# Patient Record
Sex: Male | Born: 1950
Health system: Southern US, Community
[De-identification: ages and names within clinical notes are randomized; demographics above are authoritative.]

## PROBLEM LIST (undated history)

## (undated) DIAGNOSIS — G4733 Obstructive sleep apnea (adult) (pediatric): Secondary | ICD-10-CM

## (undated) DIAGNOSIS — G5600 Carpal tunnel syndrome, unspecified upper limb: Secondary | ICD-10-CM

## (undated) DIAGNOSIS — G4730 Sleep apnea, unspecified: Secondary | ICD-10-CM

## (undated) DIAGNOSIS — Z8619 Personal history of other infectious and parasitic diseases: Secondary | ICD-10-CM

## (undated) DIAGNOSIS — G5622 Lesion of ulnar nerve, left upper limb: Secondary | ICD-10-CM

## (undated) HISTORY — PX: COLOSTOMY: SHX63

## (undated) HISTORY — DX: Sleep apnea, unspecified: G47.30

## (undated) HISTORY — DX: Carpal tunnel syndrome, unspecified upper limb: G56.00

## (undated) HISTORY — PX: CARPAL TUNNEL WITH CUBITAL TUNNEL: SHX5608

## (undated) HISTORY — DX: Obstructive sleep apnea (adult) (pediatric): G47.33

## (undated) HISTORY — PX: BACK SURGERY: SHX140

## (undated) HISTORY — PX: SHOULDER SURGERY: SHX246

## (undated) HISTORY — PX: NECK SURGERY: SHX720

## (undated) HISTORY — PX: CERVICAL FUSION: SHX112

---

## 2017-09-27 DIAGNOSIS — M19021 Primary osteoarthritis, right elbow: Secondary | ICD-10-CM | POA: Diagnosis not present

## 2017-10-02 DIAGNOSIS — M546 Pain in thoracic spine: Secondary | ICD-10-CM | POA: Diagnosis not present

## 2017-10-02 DIAGNOSIS — S239XXA Sprain of unspecified parts of thorax, initial encounter: Secondary | ICD-10-CM | POA: Diagnosis not present

## 2017-11-04 DIAGNOSIS — M25512 Pain in left shoulder: Secondary | ICD-10-CM | POA: Diagnosis not present

## 2017-12-19 ENCOUNTER — Telehealth: Payer: Self-pay | Admitting: Pulmonary Disease

## 2017-12-19 NOTE — Telephone Encounter (Signed)
Spoke with pt. Pt has an upcoming sleep consult with [facility] on 01/24/18. He is needing a copy of his download report for his life insurance policy. Advised him to bring his SD card with him and we would happy to give him a copy of his download. Nothing further was needed.

## 2018-01-20 DIAGNOSIS — G5602 Carpal tunnel syndrome, left upper limb: Secondary | ICD-10-CM | POA: Diagnosis not present

## 2018-01-20 DIAGNOSIS — Z6824 Body mass index (BMI) 24.0-24.9, adult: Secondary | ICD-10-CM | POA: Diagnosis not present

## 2018-01-20 DIAGNOSIS — M5090 Cervical disc disorder, unspecified, unspecified cervical region: Secondary | ICD-10-CM | POA: Diagnosis not present

## 2018-01-20 DIAGNOSIS — Z1389 Encounter for screening for other disorder: Secondary | ICD-10-CM | POA: Diagnosis not present

## 2018-01-20 DIAGNOSIS — M5136 Other intervertebral disc degeneration, lumbar region: Secondary | ICD-10-CM | POA: Diagnosis not present

## 2018-01-24 ENCOUNTER — Ambulatory Visit: Payer: 59 | Admitting: Pulmonary Disease

## 2018-01-24 ENCOUNTER — Encounter: Payer: Self-pay | Admitting: Pulmonary Disease

## 2018-01-24 VITALS — BP 100/60 | HR 55 | Ht 71.0 in | Wt 184.8 lb

## 2018-01-24 DIAGNOSIS — Z9989 Dependence on other enabling machines and devices: Secondary | ICD-10-CM

## 2018-01-24 DIAGNOSIS — G4733 Obstructive sleep apnea (adult) (pediatric): Secondary | ICD-10-CM | POA: Diagnosis not present

## 2018-01-24 NOTE — Progress Notes (Signed)
   Subjective:    Patient ID: Richard Schneider, male    DOB: December 23, 1950, 67 y.o.   MRN: [id number]  HPI    Review of Systems  Constitutional: Negative for fever and unexpected weight change.  HENT: Negative for congestion, dental problem, ear pain, nosebleeds, postnasal drip, rhinorrhea, sinus pressure, sneezing, sore throat and trouble swallowing.   Eyes: Negative for redness and itching.  Respiratory: Negative for cough, chest tightness, shortness of breath and wheezing.   Cardiovascular: Negative for palpitations and leg swelling.  Gastrointestinal: Negative for nausea and vomiting.  Genitourinary: Negative for dysuria.  Musculoskeletal: Negative for joint swelling.  Skin: Negative for rash.  Allergic/Immunologic: Negative.  Negative for environmental allergies, food allergies and immunocompromised state.  Neurological: Negative for headaches.  Hematological: Does not bruise/bleed easily.  Psychiatric/Behavioral: Negative for dysphoric mood. The patient is not nervous/anxious.        Objective:   Physical Exam        Assessment & Plan:

## 2018-01-24 NOTE — Patient Instructions (Addendum)
Check with your dentist about whether you are a candidate for an oral appliance to treat obstructive sleep apnea  Will arrange for new home care company to supply CPAP mask and parts  Call if you decide you want to get a new CPAP machine  Please forward a copy of your sleep study to our office  Follow up in 6 months

## 2018-01-24 NOTE — Progress Notes (Signed)
[facility]  Chief Complaint  Patient presents with  . sleep consult    self referral. Pt been on cpap over 18 years. Needs to establish sleep physician; changed jobs, and need to get new machine with supplies and new DME    Vital signs: BP 100/60 (BP Location: Left Arm, Cuff Size: Normal)   Pulse (!) 55   Ht 5\' 11"  (1.803 m)   Wt 184 lb 12.8 oz (83.8 kg)   SpO2 100%   BMI 25.77 kg/m   History of Present Illness: Richard Schneider is a 67 y.o. male for evaluation of sleep problems.  He has a sleep study while living in [place] about 18 years ago.  From his description he had a split night study.  He has been using CPAP ever since.  He has moved around to several places.  He returned to [facility] about 1 year ago.  He says he can't sleep without CPAP.  If he falls asleep before putting CPAP on, he will then wake up with a cough and choking feeling.  He will also snore.  He feels sleepy during the day if he doesn't use CPAP the night before, then he feels very sleepy.  His CPAP download for past 90 days shows AHI 1.2 with CPAP 8 cm H2O with 8 hours use per night on average.  He uses nasal pillows mask.  He goes to sleep at 9 pm.  He falls asleep within 20 minutes.  He wakes up several times due to pain in his left arm from carpal tunnel.  He gets out of bed at 530 am.  He feels okay in the morning.  He denies morning headache.  He does not use anything to help him fall sleep or stay awake.  He denies sleep walking, sleep talking, bruxism, or nightmares.  There is no history of restless legs.  He denies sleep hallucinations, sleep paralysis, or cataplexy.  The Epworth score is 2 out of 24.    Physical Exam:  General - pleasant Eyes - pupils reactive ENT - no sinus tenderness, no oral exudate, no LAN, MP 2 Cardiac - regular, no murmur Chest - no wheeze, rales Abd - soft, non tender Ext - no edema Skin - no rashes Neuro - normal  strength Psych - normal mood  Discussion: He has history of sleep apnea and has been on CPAP.  He is needing a new DME for replacement supplies.  He might be interested in a new machine, and is interested in alternative therapies to CPAP.  We discussed how sleep apnea can affect various health problems, including risks for hypertension, cardiovascular disease, and diabetes.  We also discussed how sleep disruption can increase risks for accidents, such as while driving.  Weight loss as a means of improving sleep apnea was also reviewed.  Additional treatment options discussed were CPAP therapy, oral appliance, and surgical intervention.  Assessment/Plan:  Obstructive sleep apnea. - he will forward a copy of his prior sleep study - will arrange for new DME to set up supplies - he will call if he wants to get a new machine >> explained that he might need new sleep study - he will check with his dentist about whether he is a candidate for an oral appliance   Patient Instructions  Check with your dentist about whether you are a candidate for an oral appliance to treat obstructive sleep apnea  Will arrange for new home care company  to supply CPAP mask and parts  Call if you decide you want to get a new CPAP machine  Please forward a copy of your sleep study to our office  Follow up in 6 months    Chesley Mires, MD [place] 01/24/2018, 4:13 PM Pager:  [phone]  Flow Sheet  Sleep tests:  Review of Systems: Review of Systems  Constitutional: Negative for fever and unexpected weight change.  HENT: Negative for congestion, dental problem, ear pain, nosebleeds, postnasal drip, rhinorrhea, sinus pressure, sneezing, sore throat and trouble swallowing.   Eyes: Negative for redness and itching.  Respiratory: Negative for cough, chest tightness, shortness of breath and wheezing.   Cardiovascular: Negative for palpitations and leg swelling.  Gastrointestinal:  Negative for nausea and vomiting.  Genitourinary: Negative for dysuria.  Musculoskeletal: Negative for joint swelling.  Skin: Negative for rash.  Allergic/Immunologic: Negative.  Negative for environmental allergies, food allergies and immunocompromised state.  Neurological: Negative for headaches.  Hematological: Does not bruise/bleed easily.  Psychiatric/Behavioral: Negative for dysphoric mood. The patient is not nervous/anxious.    Past Medical History: He  has a past medical history of Carpal tunnel syndrome and OSA (obstructive sleep apnea).  Past Surgical History: He  has a past surgical history that includes Back surgery and Neck surgery.  Family History: His family history includes Cancer in his maternal grandmother; Heart block in his mother.  Social History: He  reports that  has never smoked. he has never used smokeless tobacco. He reports that he drinks about 3.0 oz of alcohol per week. He reports that he does not use drugs.  Medications: Allergies as of 01/24/2018   Not on File     Medication List    as of 01/24/2018  4:13 PM   You have not been prescribed any medications.

## 2018-01-27 ENCOUNTER — Telehealth: Payer: Self-pay | Admitting: Pulmonary Disease

## 2018-01-27 DIAGNOSIS — G4733 Obstructive sleep apnea (adult) (pediatric): Secondary | ICD-10-CM

## 2018-01-27 NOTE — Telephone Encounter (Signed)
Richard Schneider can you look out for this?

## 2018-01-27 NOTE — Telephone Encounter (Signed)
Spoke with Barbaraann Rondo, he needs another order for CPAP supplies with the [employer]. Order placed. He told rodney that he would come by here to drop off the sleep study today. Barbaraann Rondo needs this faxed to him as well.

## 2018-01-28 ENCOUNTER — Telehealth: Payer: Self-pay | Admitting: Pulmonary Disease

## 2018-01-28 NOTE — Telephone Encounter (Signed)
Routing this to [facility] for follow up.

## 2018-01-29 NOTE — Telephone Encounter (Signed)
We have received the sleep study and it is currently in Dr. Juanetta Gosling office.  Will wait for [facility] to view the HST and then fax to [facility].  Routing this to [facility] to be on the look out for the HST after Dr. Halford Chessman reviews it.

## 2018-01-30 ENCOUNTER — Encounter: Payer: Self-pay | Admitting: Pulmonary Disease

## 2018-01-30 ENCOUNTER — Telehealth: Payer: Self-pay | Admitting: Pulmonary Disease

## 2018-01-30 DIAGNOSIS — G4733 Obstructive sleep apnea (adult) (pediatric): Secondary | ICD-10-CM | POA: Insufficient documentation

## 2018-01-30 NOTE — Telephone Encounter (Signed)
Called and advised patient that this is something he will have to contact is medical insurance about to see if it is covered. He verbalized understanding. Advised patient that if he needed anything to let us know.

## 2018-01-30 NOTE — Telephone Encounter (Signed)
See phone note from 2.25.19. Will sign off.

## 2018-01-30 NOTE — Telephone Encounter (Signed)
Will forward to [facility]. After VS reviews the HST then we need to fax to [place] with [facility] to have on file. Will keep message open to follow up on.

## 2018-01-31 NOTE — Telephone Encounter (Signed)
Ok, order placed with the corrections. FYI PCC"s.

## 2018-01-31 NOTE — Telephone Encounter (Signed)
Rec'd sleep study forms from [facility] this morning Faxed copy to [place] with [facility] today Per [place] in [facility], they do not need copy Nothing further needed at this time

## 2018-01-31 NOTE — Telephone Encounter (Signed)
[place].  Calling stating order for cpap supplies needs to be changed from "mask of choice" TO "nasal pillow mask" and needs standard tubing.

## 2018-01-31 NOTE — Telephone Encounter (Signed)
I will send the order once it's signed  °

## 2018-02-03 NOTE — Telephone Encounter (Signed)
Routing message to VS to sign order for the pt's nasal pillow mask Therefore [facility] can provide to complete order for patient  VS could you please sign the order request

## 2018-02-05 DIAGNOSIS — G5622 Lesion of ulnar nerve, left upper limb: Secondary | ICD-10-CM | POA: Diagnosis not present

## 2018-02-05 DIAGNOSIS — M25512 Pain in left shoulder: Secondary | ICD-10-CM | POA: Diagnosis not present

## 2018-02-05 DIAGNOSIS — G5602 Carpal tunnel syndrome, left upper limb: Secondary | ICD-10-CM | POA: Diagnosis not present

## 2018-02-05 NOTE — Telephone Encounter (Signed)
It appears that order has been signed. Will close encounter, as nothing further is needed.

## 2018-03-03 DIAGNOSIS — G4733 Obstructive sleep apnea (adult) (pediatric): Secondary | ICD-10-CM | POA: Diagnosis not present

## 2018-03-24 DIAGNOSIS — G5602 Carpal tunnel syndrome, left upper limb: Secondary | ICD-10-CM | POA: Diagnosis not present

## 2018-03-24 DIAGNOSIS — M79642 Pain in left hand: Secondary | ICD-10-CM | POA: Diagnosis not present

## 2018-03-24 DIAGNOSIS — G5622 Lesion of ulnar nerve, left upper limb: Secondary | ICD-10-CM | POA: Diagnosis not present

## 2018-05-26 DIAGNOSIS — M79642 Pain in left hand: Secondary | ICD-10-CM | POA: Diagnosis not present

## 2018-05-26 DIAGNOSIS — G5602 Carpal tunnel syndrome, left upper limb: Secondary | ICD-10-CM | POA: Diagnosis not present

## 2018-05-26 DIAGNOSIS — G5622 Lesion of ulnar nerve, left upper limb: Secondary | ICD-10-CM | POA: Diagnosis not present

## 2018-06-04 DIAGNOSIS — M25561 Pain in right knee: Secondary | ICD-10-CM | POA: Diagnosis not present

## 2018-06-04 DIAGNOSIS — M1611 Unilateral primary osteoarthritis, right hip: Secondary | ICD-10-CM | POA: Diagnosis not present

## 2018-06-06 ENCOUNTER — Emergency Department (HOSPITAL_COMMUNITY)
Admission: EM | Admit: 2018-06-06 | Discharge: 2018-06-06 | Disposition: A | Discharge status: Home / Self Care | Payer: 59 | Attending: Emergency Medicine | Admitting: Emergency Medicine

## 2018-06-06 ENCOUNTER — Encounter (HOSPITAL_COMMUNITY): Payer: Self-pay | Admitting: Emergency Medicine

## 2018-06-06 DIAGNOSIS — B0239 Other herpes zoster eye disease: Secondary | ICD-10-CM | POA: Diagnosis not present

## 2018-06-06 DIAGNOSIS — B0222 Postherpetic trigeminal neuralgia: Secondary | ICD-10-CM

## 2018-06-06 DIAGNOSIS — R51 Headache: Secondary | ICD-10-CM | POA: Diagnosis not present

## 2018-06-06 DIAGNOSIS — B028 Zoster with other complications: Secondary | ICD-10-CM | POA: Diagnosis not present

## 2018-06-06 MED ORDER — FLUORESCEIN SODIUM 1 MG OP STRP
1.0000 | ORAL_STRIP | Freq: Once | OPHTHALMIC | Status: DC
  Filled 2018-06-06: qty 1

## 2018-06-06 NOTE — ED Triage Notes (Signed)
Pt also reports he has had Cortisone injections x 2 in last 2 weeks.

## 2018-06-06 NOTE — Discharge Instructions (Addendum)
See ophthalmologist Monday.  If you develop pinkeye symptoms, fevers, pain when you move your eye or other concerns please return to the ER over the weekend. Take tylenol for pain.

## 2018-06-06 NOTE — ED Provider Notes (Signed)
[facility] EMERGENCY DEPARTMENT Provider Note   CSN: [id number] Arrival date & time: 06/06/18  4315     History   Chief Complaint Chief Complaint  Patient presents with  . Facial Pain    HPI Richard Schneider is a 67 y.o. male.  Patient presents with concern for shingles.  Patient's had small amount of tingling and burning sensation of the left face.  Patient has not had any lesions on his nose and feels possibly a small lesion on his left upper cheek.  No fevers or chills.  No loss of vision.  No significant eye pain however patient does have mild sensation in the left eye that is different.  Patient has an eye doctor to follow-up with.  Patient was given valacyclovir by provider prior to arrival.  Patient has not started taking it.     Past Medical History:  Diagnosis Date  . Carpal tunnel syndrome   . OSA (obstructive sleep apnea)     Patient Active Problem List   Diagnosis Date Noted  . OSA (obstructive sleep apnea) 01/30/2018    Past Surgical History:  Procedure Laterality Date  . BACK SURGERY    . NECK SURGERY          Home Medications    Prior to Admission medications   Medication Sig Start Date End Date Taking? Authorizing Provider  valACYclovir (VALTREX) 1000 MG tablet Take 1,000 mg by mouth 3 (three) times daily. For 7 days 06/06/18   [provider]    Family History Family History  Problem Relation Age of Onset  . Heart block Mother   . Cancer Maternal Grandmother     Social History Social History   Tobacco Use  . Smoking status: Never Smoker  . Smokeless tobacco: Never Used  Substance Use Topics  . Alcohol use: Yes    Alcohol/week: 3.0 oz    Types: 2 Shots of liquor, 3 Glasses of wine per week  . Drug use: No     Allergies   Codeine   Review of Systems Review of Systems  Constitutional: Negative for chills and fever.  HENT: Negative for congestion.   Eyes: Positive for pain. Negative for photophobia,  discharge, redness, itching and visual disturbance.  Gastrointestinal: Negative for abdominal pain and vomiting.  Genitourinary: Negative for dysuria.  Musculoskeletal: Negative for back pain, neck pain and neck stiffness.  Skin: Negative for rash.  Neurological: Positive for numbness. Negative for weakness, light-headedness and headaches.     Physical Exam Updated Vital Signs BP 135/74 (BP Location: Right Arm)   Pulse 62   Temp 98.6 F (37 C) (Oral)   Resp 18   Ht 5\' 11"  (1.803 m)   Wt 82.6 kg (182 lb)   SpO2 96%   BMI 25.38 kg/m   Physical Exam  Constitutional: He is oriented to person, place, and time. He appears well-developed and well-nourished.  HENT:  Head: Normocephalic and atraumatic.  Eyes: Conjunctivae are normal. Right eye exhibits no discharge. Left eye exhibits no discharge.  Visual fields intact, no pain with eomf, no increased uptake with fluorescein.  No lesion on nose or left ear.  Neck: Normal range of motion. Neck supple. No tracheal deviation present.  Cardiovascular: Normal rate and regular rhythm.  Pulmonary/Chest: Effort normal.  Abdominal: Soft.  Musculoskeletal: He exhibits no edema.  Neurological: He is alert and oriented to person, place, and time. No cranial nerve deficit.  Skin: Skin is warm.  Possible small lesion  left upper face  Psychiatric: He has a normal mood and affect.  Nursing note and vitals reviewed.    ED Treatments / Results  Labs (all labs ordered are listed, but only abnormal results are displayed) Labs Reviewed - No data to display  EKG None  Radiology No results found.  Procedures Procedures (including critical care time)  Medications Ordered in ED Medications  fluorescein ophthalmic strip 1 strip (has no administration in time range)     Initial Impression / Assessment and Plan / ED Course  I have reviewed the triage vital signs and the nursing notes.  Pertinent labs & imaging results that were available  during my care of the patient were reviewed by me and considered in my medical decision making (see chart for details).    Patient presents with concern for possible early zoster.  Patient has possibly 1 lesion on the left face.  No obvious eye involvement at this time however patient does feel minimal symptoms in the left eye.  Discussed importance of follow-up with ophthalmology in the next 2 or 3 days.  Discussed strict reasons to return.  Patient has prescription for antiviral medications.  No cranial nerve deficits.  No stroke symptoms.  Results and differential diagnosis were discussed with the patient/parent/guardian. Xrays were independently reviewed by myself.  Close follow up outpatient was discussed, comfortable with the plan.   Medications  fluorescein ophthalmic strip 1 strip (has no administration in time range)    Vitals:   06/06/18 1915 06/06/18 1916  BP: 135/74   Pulse: 62   Resp: 18   Temp: 98.6 F (37 C)   TempSrc: Oral   SpO2: 96%   Weight:  82.6 kg (182 lb)  Height:  5\' 11"  (1.803 m)    Final diagnoses:  Trigeminal herpes zoster     Final Clinical Impressions(s) / ED Diagnoses   Final diagnoses:  Trigeminal herpes zoster    ED Discharge Orders    None       Elnora Morrison, MD 06/06/18 2318

## 2018-06-06 NOTE — ED Provider Notes (Signed)
Patient placed in Quick Look pathway, seen and evaluated   Chief Complaint: rash ? shingles  HPI:   Richard Schneider is a 67 y.o. male who present to the ED with left side facial pain that started a couple days ago.patient reports having ulcers inside his mouth. Patient also reports small blister area to the left chin. Patient has started having pain that radiates to the left ear and and left cheek. Patient went to [facility] and they thought patient may have shingles and told patient to come to the ED for evaluation. Patient did get Rx for Valtrex but has not filled yet.   ROS: ENT: ulcers in mouth  Skin: lesion,   Head: facial pain left side  Physical Exam:  BP 135/74 (BP Location: Right Arm)   Pulse 62   Temp 98.6 F (37 C) (Oral)   Resp 18   SpO2 96%    Gen: No distress  Neuro: Awake and Alert  Skin: small lesion left chin  Ulcer areas inside the mouth left cheek     Initiation of care has begun. The patient has been counseled on the process, plan, and necessity for staying for the completion/evaluation, and the remainder of the medical screening examination    Ashley Murrain, NP 06/06/18 1916    Elnora Morrison, MD 06/07/18 [phone]

## 2018-06-06 NOTE — ED Triage Notes (Signed)
Pt c/o L sided facial pain  onset 3 days ago, pt states he noticed a small sore in hair line near chin, pt states pain today radiated to cheek, forehead and ear, pt was seen at [facility] and given Valtex but was told to come to ED for confirmation of Shingles.  Pt states he has developed sore in L cheek and under tongue on L side. 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

## 2018-06-23 DIAGNOSIS — B0239 Other herpes zoster eye disease: Secondary | ICD-10-CM | POA: Diagnosis not present

## 2018-06-24 DIAGNOSIS — H168 Other keratitis: Secondary | ICD-10-CM | POA: Diagnosis not present

## 2018-06-27 DIAGNOSIS — B0221 Postherpetic geniculate ganglionitis: Secondary | ICD-10-CM | POA: Diagnosis not present

## 2018-07-01 DIAGNOSIS — G510 Bell's palsy: Secondary | ICD-10-CM | POA: Insufficient documentation

## 2018-07-01 DIAGNOSIS — H168 Other keratitis: Secondary | ICD-10-CM | POA: Diagnosis not present

## 2018-07-01 DIAGNOSIS — H903 Sensorineural hearing loss, bilateral: Secondary | ICD-10-CM | POA: Diagnosis not present

## 2018-07-09 ENCOUNTER — Other Ambulatory Visit: Payer: Self-pay | Admitting: Otolaryngology

## 2018-07-09 DIAGNOSIS — G510 Bell's palsy: Secondary | ICD-10-CM

## 2018-07-11 DIAGNOSIS — Z0000 Encounter for general adult medical examination without abnormal findings: Secondary | ICD-10-CM | POA: Diagnosis not present

## 2018-07-11 DIAGNOSIS — Z125 Encounter for screening for malignant neoplasm of prostate: Secondary | ICD-10-CM | POA: Diagnosis not present

## 2018-07-11 DIAGNOSIS — R82998 Other abnormal findings in urine: Secondary | ICD-10-CM | POA: Diagnosis not present

## 2018-07-15 ENCOUNTER — Other Ambulatory Visit: Payer: 59

## 2018-07-18 DIAGNOSIS — Z1212 Encounter for screening for malignant neoplasm of rectum: Secondary | ICD-10-CM | POA: Diagnosis not present

## 2018-07-21 DIAGNOSIS — H469 Unspecified optic neuritis: Secondary | ICD-10-CM | POA: Diagnosis not present

## 2018-07-21 DIAGNOSIS — H43822 Vitreomacular adhesion, left eye: Secondary | ICD-10-CM | POA: Diagnosis not present

## 2018-07-21 DIAGNOSIS — H168 Other keratitis: Secondary | ICD-10-CM | POA: Diagnosis not present

## 2018-07-30 ENCOUNTER — Other Ambulatory Visit: Payer: 59

## 2018-08-05 DIAGNOSIS — G510 Bell's palsy: Secondary | ICD-10-CM | POA: Diagnosis not present

## 2018-08-21 DIAGNOSIS — Z23 Encounter for immunization: Secondary | ICD-10-CM | POA: Diagnosis not present

## 2018-08-27 ENCOUNTER — Encounter (HOSPITAL_BASED_OUTPATIENT_CLINIC_OR_DEPARTMENT_OTHER): Payer: Self-pay | Admitting: *Deleted

## 2018-08-27 ENCOUNTER — Other Ambulatory Visit: Payer: Self-pay

## 2018-08-27 DIAGNOSIS — M79642 Pain in left hand: Secondary | ICD-10-CM | POA: Diagnosis not present

## 2018-08-27 DIAGNOSIS — G5602 Carpal tunnel syndrome, left upper limb: Secondary | ICD-10-CM | POA: Diagnosis not present

## 2018-08-27 DIAGNOSIS — G5622 Lesion of ulnar nerve, left upper limb: Secondary | ICD-10-CM | POA: Diagnosis not present

## 2018-08-29 ENCOUNTER — Ambulatory Visit (HOSPITAL_BASED_OUTPATIENT_CLINIC_OR_DEPARTMENT_OTHER): Admission: RE | Admit: 2018-08-29 | Payer: 59 | Source: Ambulatory Visit | Admitting: Orthopedic Surgery

## 2018-08-29 DIAGNOSIS — G5622 Lesion of ulnar nerve, left upper limb: Secondary | ICD-10-CM | POA: Diagnosis not present

## 2018-08-29 DIAGNOSIS — G5602 Carpal tunnel syndrome, left upper limb: Secondary | ICD-10-CM | POA: Diagnosis not present

## 2018-08-29 HISTORY — DX: Personal history of other infectious and parasitic diseases: Z86.19

## 2018-08-29 HISTORY — DX: Lesion of ulnar nerve, left upper limb: G56.22

## 2018-08-29 SURGERY — CARPAL TUNNEL RELEASE
Anesthesia: Choice | Laterality: Left

## 2018-09-01 ENCOUNTER — Telehealth: Payer: Self-pay | Admitting: Pulmonary Disease

## 2018-09-01 DIAGNOSIS — G4733 Obstructive sleep apnea (adult) (pediatric): Secondary | ICD-10-CM

## 2018-09-01 NOTE — Telephone Encounter (Signed)
Called and spoke with pt regarding new cpap machine Pt explained that he met his deductible that ends today, and would like new cpap machine His old cpap machine is over 65 years old, he bought it online Pt would like to use his insurance for the new machine with all supplies, DME - [facility] Pt advised that he would like order placed today Pt denied having set up 46mof/u with [facility] from Feb 2019 appt, and will call back later to reschedule f/u  TP [facility] [place] [facility] [place], mask of choice and [facility] They will try to process the order as quick as possible and call pt tomorrow with update F/u with pt later this week.

## 2018-09-02 NOTE — Telephone Encounter (Signed)
[place] calling stating that pt will need face to face ov and in ov it should be dicussed a/b usage and benefits of cpap Corene Cornea [phone].Hillery Hunter

## 2018-09-02 NOTE — Telephone Encounter (Signed)
Attempted to contact pt. I did not receive an answer. There was no option for me to leave a message. Will try back.  

## 2018-09-03 DIAGNOSIS — G4733 Obstructive sleep apnea (adult) (pediatric): Secondary | ICD-10-CM | POA: Diagnosis not present

## 2018-09-03 NOTE — Telephone Encounter (Signed)
Called and spoke to patient, patient spoke with [facility] and they told him he would be responsible for the complete price of the cpap because it would be filed under his new deductible because he met his old deductible therefore patient does not want to move forward with order at this time as he does not want to pay for it. Nothing further needed at this time.

## 2018-09-12 DIAGNOSIS — M6281 Muscle weakness (generalized): Secondary | ICD-10-CM | POA: Diagnosis not present

## 2019-02-04 DIAGNOSIS — H9313 Tinnitus, bilateral: Secondary | ICD-10-CM | POA: Diagnosis not present

## 2019-02-04 DIAGNOSIS — H9312 Tinnitus, left ear: Secondary | ICD-10-CM | POA: Diagnosis not present

## 2019-02-04 DIAGNOSIS — H9113 Presbycusis, bilateral: Secondary | ICD-10-CM | POA: Diagnosis not present

## 2019-02-04 DIAGNOSIS — H9191 Unspecified hearing loss, right ear: Secondary | ICD-10-CM | POA: Diagnosis not present

## 2019-02-04 DIAGNOSIS — M47812 Spondylosis without myelopathy or radiculopathy, cervical region: Secondary | ICD-10-CM | POA: Diagnosis not present

## 2019-02-04 DIAGNOSIS — R1314 Dysphagia, pharyngoesophageal phase: Secondary | ICD-10-CM | POA: Diagnosis not present

## 2019-02-04 DIAGNOSIS — G510 Bell's palsy: Secondary | ICD-10-CM | POA: Diagnosis not present

## 2019-02-05 DIAGNOSIS — M47812 Spondylosis without myelopathy or radiculopathy, cervical region: Secondary | ICD-10-CM | POA: Insufficient documentation

## 2019-02-05 DIAGNOSIS — H9313 Tinnitus, bilateral: Secondary | ICD-10-CM | POA: Insufficient documentation

## 2019-02-05 DIAGNOSIS — H9113 Presbycusis, bilateral: Secondary | ICD-10-CM | POA: Insufficient documentation

## 2019-02-05 DIAGNOSIS — R1314 Dysphagia, pharyngoesophageal phase: Secondary | ICD-10-CM | POA: Insufficient documentation

## 2019-02-09 ENCOUNTER — Other Ambulatory Visit: Payer: Self-pay | Admitting: Otolaryngology

## 2019-02-09 DIAGNOSIS — M47812 Spondylosis without myelopathy or radiculopathy, cervical region: Secondary | ICD-10-CM

## 2019-02-09 DIAGNOSIS — R1314 Dysphagia, pharyngoesophageal phase: Secondary | ICD-10-CM

## 2019-04-23 DIAGNOSIS — M25512 Pain in left shoulder: Secondary | ICD-10-CM | POA: Diagnosis not present

## 2019-04-23 DIAGNOSIS — M25519 Pain in unspecified shoulder: Secondary | ICD-10-CM | POA: Insufficient documentation

## 2019-04-23 DIAGNOSIS — M7542 Impingement syndrome of left shoulder: Secondary | ICD-10-CM | POA: Diagnosis not present

## 2019-04-23 DIAGNOSIS — M13819 Other specified arthritis, unspecified shoulder: Secondary | ICD-10-CM | POA: Diagnosis not present

## 2019-07-20 DIAGNOSIS — R82998 Other abnormal findings in urine: Secondary | ICD-10-CM | POA: Diagnosis not present

## 2019-07-29 DIAGNOSIS — H43822 Vitreomacular adhesion, left eye: Secondary | ICD-10-CM | POA: Diagnosis not present

## 2019-08-03 DIAGNOSIS — B353 Tinea pedis: Secondary | ICD-10-CM | POA: Diagnosis not present

## 2019-08-03 DIAGNOSIS — M25511 Pain in right shoulder: Secondary | ICD-10-CM | POA: Diagnosis not present

## 2019-08-03 DIAGNOSIS — E781 Pure hyperglyceridemia: Secondary | ICD-10-CM | POA: Diagnosis not present

## 2019-08-03 DIAGNOSIS — Z0000 Encounter for general adult medical examination without abnormal findings: Secondary | ICD-10-CM | POA: Diagnosis not present

## 2019-08-06 DIAGNOSIS — M65342 Trigger finger, left ring finger: Secondary | ICD-10-CM | POA: Insufficient documentation

## 2019-08-06 DIAGNOSIS — M7542 Impingement syndrome of left shoulder: Secondary | ICD-10-CM | POA: Diagnosis not present

## 2019-08-06 DIAGNOSIS — M7541 Impingement syndrome of right shoulder: Secondary | ICD-10-CM | POA: Diagnosis not present

## 2019-09-04 DIAGNOSIS — M7541 Impingement syndrome of right shoulder: Secondary | ICD-10-CM | POA: Diagnosis not present

## 2019-09-11 DIAGNOSIS — Z23 Encounter for immunization: Secondary | ICD-10-CM | POA: Diagnosis not present

## 2019-09-21 DIAGNOSIS — M75121 Complete rotator cuff tear or rupture of right shoulder, not specified as traumatic: Secondary | ICD-10-CM | POA: Diagnosis not present

## 2019-09-21 DIAGNOSIS — M19011 Primary osteoarthritis, right shoulder: Secondary | ICD-10-CM | POA: Diagnosis not present

## 2019-09-23 DIAGNOSIS — Z03818 Encounter for observation for suspected exposure to other biological agents ruled out: Secondary | ICD-10-CM | POA: Diagnosis not present

## 2019-10-06 DIAGNOSIS — G8918 Other acute postprocedural pain: Secondary | ICD-10-CM | POA: Diagnosis not present

## 2019-10-06 DIAGNOSIS — M19011 Primary osteoarthritis, right shoulder: Secondary | ICD-10-CM | POA: Diagnosis not present

## 2019-10-06 DIAGNOSIS — Y999 Unspecified external cause status: Secondary | ICD-10-CM | POA: Diagnosis not present

## 2019-10-06 DIAGNOSIS — S43431A Superior glenoid labrum lesion of right shoulder, initial encounter: Secondary | ICD-10-CM | POA: Diagnosis not present

## 2019-10-06 DIAGNOSIS — M24111 Other articular cartilage disorders, right shoulder: Secondary | ICD-10-CM | POA: Diagnosis not present

## 2019-10-06 DIAGNOSIS — X58XXXA Exposure to other specified factors, initial encounter: Secondary | ICD-10-CM | POA: Diagnosis not present

## 2019-10-06 DIAGNOSIS — M75121 Complete rotator cuff tear or rupture of right shoulder, not specified as traumatic: Secondary | ICD-10-CM | POA: Diagnosis not present

## 2019-10-06 DIAGNOSIS — M7541 Impingement syndrome of right shoulder: Secondary | ICD-10-CM | POA: Diagnosis not present

## 2019-10-06 DIAGNOSIS — M7551 Bursitis of right shoulder: Secondary | ICD-10-CM | POA: Diagnosis not present

## 2019-10-06 DIAGNOSIS — S46011A Strain of muscle(s) and tendon(s) of the rotator cuff of right shoulder, initial encounter: Secondary | ICD-10-CM | POA: Diagnosis not present

## 2019-10-06 DIAGNOSIS — S46191A Other injury of muscle, fascia and tendon of long head of biceps, right arm, initial encounter: Secondary | ICD-10-CM | POA: Diagnosis not present

## 2019-10-15 DIAGNOSIS — M79671 Pain in right foot: Secondary | ICD-10-CM | POA: Diagnosis not present

## 2019-10-15 DIAGNOSIS — M25511 Pain in right shoulder: Secondary | ICD-10-CM | POA: Diagnosis not present

## 2019-10-16 DIAGNOSIS — M25511 Pain in right shoulder: Secondary | ICD-10-CM | POA: Insufficient documentation

## 2019-10-19 DIAGNOSIS — Z4789 Encounter for other orthopedic aftercare: Secondary | ICD-10-CM | POA: Diagnosis not present

## 2019-10-21 DIAGNOSIS — M25511 Pain in right shoulder: Secondary | ICD-10-CM | POA: Diagnosis not present

## 2019-10-28 DIAGNOSIS — M25511 Pain in right shoulder: Secondary | ICD-10-CM | POA: Diagnosis not present

## 2019-11-04 DIAGNOSIS — M25511 Pain in right shoulder: Secondary | ICD-10-CM | POA: Diagnosis not present

## 2019-11-11 DIAGNOSIS — M25511 Pain in right shoulder: Secondary | ICD-10-CM | POA: Diagnosis not present

## 2019-11-11 DIAGNOSIS — G4733 Obstructive sleep apnea (adult) (pediatric): Secondary | ICD-10-CM | POA: Diagnosis not present

## 2019-11-13 DIAGNOSIS — M25511 Pain in right shoulder: Secondary | ICD-10-CM | POA: Diagnosis not present

## 2019-11-16 DIAGNOSIS — M25511 Pain in right shoulder: Secondary | ICD-10-CM | POA: Diagnosis not present

## 2019-11-19 DIAGNOSIS — M25511 Pain in right shoulder: Secondary | ICD-10-CM | POA: Diagnosis not present

## 2019-11-25 DIAGNOSIS — M25511 Pain in right shoulder: Secondary | ICD-10-CM | POA: Diagnosis not present

## 2019-11-26 DIAGNOSIS — M25511 Pain in right shoulder: Secondary | ICD-10-CM | POA: Diagnosis not present

## 2019-11-30 DIAGNOSIS — M25511 Pain in right shoulder: Secondary | ICD-10-CM | POA: Diagnosis not present

## 2019-12-03 DIAGNOSIS — M25511 Pain in right shoulder: Secondary | ICD-10-CM | POA: Diagnosis not present

## 2019-12-07 DIAGNOSIS — M25511 Pain in right shoulder: Secondary | ICD-10-CM | POA: Diagnosis not present

## 2019-12-10 DIAGNOSIS — M25511 Pain in right shoulder: Secondary | ICD-10-CM | POA: Diagnosis not present

## 2019-12-12 DIAGNOSIS — G4733 Obstructive sleep apnea (adult) (pediatric): Secondary | ICD-10-CM | POA: Diagnosis not present

## 2019-12-14 DIAGNOSIS — M25511 Pain in right shoulder: Secondary | ICD-10-CM | POA: Diagnosis not present

## 2019-12-17 DIAGNOSIS — M25511 Pain in right shoulder: Secondary | ICD-10-CM | POA: Diagnosis not present

## 2019-12-21 DIAGNOSIS — M25511 Pain in right shoulder: Secondary | ICD-10-CM | POA: Diagnosis not present

## 2019-12-23 DIAGNOSIS — Z4789 Encounter for other orthopedic aftercare: Secondary | ICD-10-CM | POA: Diagnosis not present

## 2019-12-23 DIAGNOSIS — M25511 Pain in right shoulder: Secondary | ICD-10-CM | POA: Diagnosis not present

## 2020-01-05 DIAGNOSIS — X58XXXA Exposure to other specified factors, initial encounter: Secondary | ICD-10-CM | POA: Diagnosis not present

## 2020-01-05 DIAGNOSIS — G8918 Other acute postprocedural pain: Secondary | ICD-10-CM | POA: Diagnosis not present

## 2020-01-05 DIAGNOSIS — S46011A Strain of muscle(s) and tendon(s) of the rotator cuff of right shoulder, initial encounter: Secondary | ICD-10-CM | POA: Diagnosis not present

## 2020-01-05 DIAGNOSIS — Y999 Unspecified external cause status: Secondary | ICD-10-CM | POA: Diagnosis not present

## 2020-01-12 DIAGNOSIS — R972 Elevated prostate specific antigen [PSA]: Secondary | ICD-10-CM | POA: Diagnosis not present

## 2020-01-12 DIAGNOSIS — G4733 Obstructive sleep apnea (adult) (pediatric): Secondary | ICD-10-CM | POA: Diagnosis not present

## 2020-01-19 DIAGNOSIS — M25511 Pain in right shoulder: Secondary | ICD-10-CM | POA: Diagnosis not present

## 2020-01-20 DIAGNOSIS — H6982 Other specified disorders of Eustachian tube, left ear: Secondary | ICD-10-CM | POA: Diagnosis not present

## 2020-01-20 DIAGNOSIS — H90A11 Conductive hearing loss, unilateral, right ear with restricted hearing on the contralateral side: Secondary | ICD-10-CM | POA: Diagnosis not present

## 2020-01-20 DIAGNOSIS — H9312 Tinnitus, left ear: Secondary | ICD-10-CM | POA: Diagnosis not present

## 2020-01-20 DIAGNOSIS — M2669 Other specified disorders of temporomandibular joint: Secondary | ICD-10-CM | POA: Diagnosis not present

## 2020-01-20 DIAGNOSIS — H903 Sensorineural hearing loss, bilateral: Secondary | ICD-10-CM | POA: Diagnosis not present

## 2020-01-20 DIAGNOSIS — H6121 Impacted cerumen, right ear: Secondary | ICD-10-CM | POA: Insufficient documentation

## 2020-01-21 DIAGNOSIS — M25511 Pain in right shoulder: Secondary | ICD-10-CM | POA: Diagnosis not present

## 2020-01-22 ENCOUNTER — Encounter: Payer: Self-pay | Admitting: Gastroenterology

## 2020-01-26 DIAGNOSIS — M25511 Pain in right shoulder: Secondary | ICD-10-CM | POA: Diagnosis not present

## 2020-01-28 DIAGNOSIS — R69 Illness, unspecified: Secondary | ICD-10-CM | POA: Diagnosis not present

## 2020-01-29 ENCOUNTER — Ambulatory Visit (AMBULATORY_SURGERY_CENTER): Payer: Self-pay

## 2020-01-29 ENCOUNTER — Other Ambulatory Visit: Payer: Self-pay

## 2020-01-29 VITALS — Temp 96.9°F | Ht 71.75 in | Wt 183.0 lb

## 2020-01-29 DIAGNOSIS — Z1211 Encounter for screening for malignant neoplasm of colon: Secondary | ICD-10-CM

## 2020-01-29 DIAGNOSIS — Z01818 Encounter for other preprocedural examination: Secondary | ICD-10-CM

## 2020-01-29 MED ORDER — NA SULFATE-K SULFATE-MG SULF 17.5-3.13-1.6 GM/177ML PO SOLN: 1.0000 | Freq: Once | ORAL | 0 refills | Status: AC

## 2020-01-29 NOTE — Progress Notes (Signed)

## 2020-02-03 ENCOUNTER — Encounter: Payer: Self-pay | Admitting: Gastroenterology

## 2020-02-03 DIAGNOSIS — R69 Illness, unspecified: Secondary | ICD-10-CM | POA: Diagnosis not present

## 2020-02-04 DIAGNOSIS — M25611 Stiffness of right shoulder, not elsewhere classified: Secondary | ICD-10-CM | POA: Diagnosis not present

## 2020-02-05 ENCOUNTER — Encounter: Payer: Self-pay | Admitting: Gastroenterology

## 2020-02-05 ENCOUNTER — Ambulatory Visit (AMBULATORY_SURGERY_CENTER): Payer: 59 | Admitting: Gastroenterology

## 2020-02-05 ENCOUNTER — Other Ambulatory Visit: Payer: Self-pay

## 2020-02-05 VITALS — BP 103/60 | HR 60 | Temp 97.5°F | Resp 15 | Ht 71.75 in | Wt 183.0 lb

## 2020-02-05 DIAGNOSIS — D128 Benign neoplasm of rectum: Secondary | ICD-10-CM

## 2020-02-05 DIAGNOSIS — Z1211 Encounter for screening for malignant neoplasm of colon: Secondary | ICD-10-CM

## 2020-02-05 DIAGNOSIS — D125 Benign neoplasm of sigmoid colon: Secondary | ICD-10-CM | POA: Diagnosis not present

## 2020-02-05 DIAGNOSIS — D129 Benign neoplasm of anus and anal canal: Secondary | ICD-10-CM

## 2020-02-05 DIAGNOSIS — D127 Benign neoplasm of rectosigmoid junction: Secondary | ICD-10-CM | POA: Diagnosis not present

## 2020-02-05 DIAGNOSIS — D124 Benign neoplasm of descending colon: Secondary | ICD-10-CM

## 2020-02-05 MED ORDER — SODIUM CHLORIDE 0.9 % IV SOLN: 500.0000 mL | Freq: Once | INTRAVENOUS | Status: DC

## 2020-02-05 NOTE — Patient Instructions (Signed)
Handouts on polyps, diverticulosis, and high fiber diet given to you today.  Await pathology results     YOU HAD AN ENDOSCOPIC PROCEDURE TODAY AT THE [facility]:   Refer to the procedure report that was given to you for any specific questions about what was found during the examination.  If the procedure report does not answer your questions, please call your gastroenterologist to clarify.  If you requested that your care partner not be given the details of your procedure findings, then the procedure report has been included in a sealed envelope for you to review at your convenience later.  YOU SHOULD EXPECT: Some feelings of bloating in the abdomen. Passage of more gas than usual.  Walking can help get rid of the air that was put into your GI tract during the procedure and reduce the bloating. If you had a lower endoscopy (such as a colonoscopy or flexible sigmoidoscopy) you may notice spotting of blood in your stool or on the toilet paper. If you underwent a bowel prep for your procedure, you may not have a normal bowel movement for a few days.  Please Note:  You might notice some irritation and congestion in your nose or some drainage.  This is from the oxygen used during your procedure.  There is no need for concern and it should clear up in a day or so.  SYMPTOMS TO REPORT IMMEDIATELY:   Following lower endoscopy (colonoscopy or flexible sigmoidoscopy):  Excessive amounts of blood in the stool  Significant tenderness or worsening of abdominal pains  Swelling of the abdomen that is new, acute  Fever of 100F or higher  For urgent or emergent issues, a gastroenterologist can be reached at any hour by calling [phone]. Do not use MyChart messaging for urgent concerns.    DIET:  We do recommend a small meal at first, but then you may proceed to your regular diet.  Drink plenty of fluids but you should avoid alcoholic beverages for 24 hours.  ACTIVITY:  You should plan  to take it easy for the rest of today and you should NOT DRIVE or use heavy machinery until tomorrow (because of the sedation medicines used during the test).    FOLLOW UP: Our staff will call the number listed on your records 48-72 hours following your procedure to check on you and address any questions or concerns that you may have regarding the information given to you following your procedure. If we do not reach you, we will leave a message.  We will attempt to reach you two times.  During this call, we will ask if you have developed any symptoms of COVID 19. If you develop any symptoms (ie: fever, flu-like symptoms, shortness of breath, cough etc.) before then, please call [phone].  If you test positive for Covid 19 in the 2 weeks post procedure, please call and report this information to us.    If any biopsies were taken you will be contacted by phone or by letter within the next 1-3 weeks.  Please call us at [phone] if you have not heard about the biopsies in 3 weeks.    SIGNATURES/CONFIDENTIALITY: You and/or your care partner have signed paperwork which will be entered into your electronic medical record.  These signatures attest to the fact that that the information above on your After Visit Summary has been reviewed and is understood.  Full responsibility of the confidentiality of this discharge information lies with you and/or your   care-partner.  

## 2020-02-05 NOTE — Progress Notes (Signed)
Called to room to assist during endoscopic procedure.  Patient ID and intended procedure confirmed with present staff. Received instructions for my participation in the procedure from the performing physician.  

## 2020-02-05 NOTE — Progress Notes (Signed)
PT taken to PACU. Monitors in place. VSS. Report given to RN. 

## 2020-02-05 NOTE — Progress Notes (Signed)
Pt's states no medical or surgical changes since previsit or office visit. 

## 2020-02-05 NOTE — Progress Notes (Signed)
DT- vitals JB- temp 

## 2020-02-05 NOTE — Op Note (Signed)
[place] Patient Name: Richard Schneider Procedure Date: 02/05/2020 3:26 PM MRN: [id number] Endoscopist: Thornton Park MD, MD Age: 69 Referring MD:  Date of Birth: [birth year] Gender: Male Account #: [id number] Procedure:                Colonoscopy Indications:              Screening for colorectal malignant neoplasm                           Normal colonoscopy in [place], [place] over 10 years                            ago                           No known family history of colon cancer or polyps Medicines:                Monitored Anesthesia Care Procedure:                Pre-Anesthesia Assessment:                           - Prior to the procedure, a History and Physical                            was performed, and patient medications and                            allergies were reviewed. The patient's tolerance of                            previous anesthesia was also reviewed. The risks                            and benefits of the procedure and the sedation                            options and risks were discussed with the patient.                            All questions were answered, and informed consent                            was obtained. Prior Anticoagulants: The patient has                            taken no previous anticoagulant or antiplatelet                            agents. ASA Grade Assessment: II - A patient with                            mild systemic disease. After reviewing the risks  and benefits, the patient was deemed in                            satisfactory condition to undergo the procedure.                           After obtaining informed consent, the colonoscope                            was passed under direct vision. Throughout the                            procedure, the patient's blood pressure, pulse, and                            oxygen saturations were monitored continuously. The                 Colonoscope was introduced through the anus and                            advanced to the 3 cm into the ileum. A second                            forward view of the right colon was performed. The                            colonoscopy was performed without difficulty. The                            patient tolerated the procedure well. The quality                            of the bowel preparation was good. The terminal                            ileum, ileocecal valve, appendiceal orifice, and                            rectum were photographed. Scope In: 3:40:09 PM Scope Out: 3:58:07 PM Scope Withdrawal Time: 0 hours 14 minutes 26 seconds  Total Procedure Duration: 0 hours 17 minutes 58 seconds  Findings:                 The perianal and digital rectal examinations were                            normal.                           A less than 1 mm polyp was found in the descending                            colon. The polyp was sessile. The polyp was removed  with a cold biopsy forceps. Resection and retrieval                            were complete. Estimated blood loss was minimal.                           A 2 mm polyp was found in the sigmoid colon. The                            polyp was sessile. The polyp was removed with a                            cold snare. Resection and retrieval were complete.                            Estimated blood loss: none.                           Two sessile polyps were found in the rectum. The                            polyps were 2 to 3 mm in size. These polyps were                            removed with a cold snare. Resection and retrieval                            were complete. Estimated blood loss was minimal.                           Multiple small and large-mouthed diverticula were                            found in the sigmoid colon and descending colon.                           The exam  was otherwise without abnormality on                            direct and retroflexion views. Complications:            No immediate complications. Estimated blood loss:                            Minimal. Estimated Blood Loss:     Estimated blood loss was minimal. Impression:               - One less than 1 mm polyp in the descending colon,                            removed with a cold biopsy forceps. Resected and  retrieved.                           - One 2 mm polyp in the sigmoid colon, removed with                            a cold snare. Resected and retrieved.                           - Two 2 to 3 mm polyps in the rectum, removed with                            a cold snare. Resected and retrieved.                           - Diverticulosis in the sigmoid colon and in the                            descending colon.                           - The examination was otherwise normal on direct                            and retroflexion views. Recommendation:           - Patient has a contact number available for                            emergencies. The signs and symptoms of potential                            delayed complications were discussed with the                            patient. Return to normal activities tomorrow.                            Written discharge instructions were provided to the                            patient.                           - High fiber diet. Drink at least 64 ounces of                            water daily. Consider adding a daily stool bulking                            agent such as Metamucil.                           - Continue present medications.                           -  Await pathology results.                           - Repeat colonoscopy date to be determined after                            pending pathology results are reviewed for                            surveillance.                            - Emerging evidence supports eating a diet of                            fruits, vegetables, grains, calcium and yogurt                            while reducing red meat and alcohol may reduce the                            risk of colon cancer. Thornton Park MD, MD 02/05/2020 4:09:56 PM This report has been signed electronically.

## 2020-02-09 ENCOUNTER — Telehealth: Payer: Self-pay | Admitting: *Deleted

## 2020-02-09 NOTE — Telephone Encounter (Signed)
  Follow up Call-  Call back number 02/05/2020  Post procedure Call Back phone  # [phone]  Permission to leave phone message Yes  Some recent data might be hidden     Patient questions:  Do you have a fever, pain , or abdominal swelling? No. Pain Score  0 *  Have you tolerated food without any problems? Yes.    Have you been able to return to your normal activities? Yes.    Do you have any questions about your discharge instructions: Diet   No. Medications  No. Follow up visit  No.  Do you have questions or concerns about your Care? No.  Actions: * If pain score is 4 or above: No action needed, pain <4.  1. Have you developed a fever since your procedure? no  2.   Have you had an respiratory symptoms (SOB or cough) since your procedure? no  3.   Have you tested positive for COVID 19 since your procedure no  4.   Have you had any family members/close contacts diagnosed with the COVID 19 since your procedure?  no   If yes to any of these questions please route to Joylene John, RN and Alphonsa Gin, Therapist, sports.

## 2020-02-10 ENCOUNTER — Encounter: Payer: Self-pay | Admitting: Gastroenterology

## 2020-02-11 DIAGNOSIS — M25511 Pain in right shoulder: Secondary | ICD-10-CM | POA: Diagnosis not present

## 2020-02-18 DIAGNOSIS — M25611 Stiffness of right shoulder, not elsewhere classified: Secondary | ICD-10-CM | POA: Diagnosis not present

## 2020-02-24 DIAGNOSIS — M65351 Trigger finger, right little finger: Secondary | ICD-10-CM | POA: Diagnosis not present

## 2020-02-24 DIAGNOSIS — Z4789 Encounter for other orthopedic aftercare: Secondary | ICD-10-CM | POA: Diagnosis not present

## 2020-02-25 DIAGNOSIS — M25511 Pain in right shoulder: Secondary | ICD-10-CM | POA: Diagnosis not present

## 2020-03-02 DIAGNOSIS — M7542 Impingement syndrome of left shoulder: Secondary | ICD-10-CM | POA: Diagnosis not present

## 2020-03-02 DIAGNOSIS — M65342 Trigger finger, left ring finger: Secondary | ICD-10-CM | POA: Diagnosis not present

## 2020-03-02 DIAGNOSIS — Z4789 Encounter for other orthopedic aftercare: Secondary | ICD-10-CM | POA: Diagnosis not present

## 2020-03-02 DIAGNOSIS — M65351 Trigger finger, right little finger: Secondary | ICD-10-CM | POA: Diagnosis not present

## 2020-03-02 DIAGNOSIS — M25642 Stiffness of left hand, not elsewhere classified: Secondary | ICD-10-CM | POA: Diagnosis not present

## 2020-03-04 DIAGNOSIS — M25511 Pain in right shoulder: Secondary | ICD-10-CM | POA: Diagnosis not present

## 2020-03-09 DIAGNOSIS — M25511 Pain in right shoulder: Secondary | ICD-10-CM | POA: Diagnosis not present

## 2020-03-23 DIAGNOSIS — M25611 Stiffness of right shoulder, not elsewhere classified: Secondary | ICD-10-CM | POA: Diagnosis not present

## 2020-03-31 DIAGNOSIS — M25511 Pain in right shoulder: Secondary | ICD-10-CM | POA: Diagnosis not present

## 2020-04-06 DIAGNOSIS — M25611 Stiffness of right shoulder, not elsewhere classified: Secondary | ICD-10-CM | POA: Diagnosis not present

## 2020-04-08 DIAGNOSIS — M25511 Pain in right shoulder: Secondary | ICD-10-CM | POA: Diagnosis not present

## 2020-04-13 DIAGNOSIS — M25511 Pain in right shoulder: Secondary | ICD-10-CM | POA: Diagnosis not present

## 2020-04-20 DIAGNOSIS — M25511 Pain in right shoulder: Secondary | ICD-10-CM | POA: Diagnosis not present

## 2020-04-22 DIAGNOSIS — M25511 Pain in right shoulder: Secondary | ICD-10-CM | POA: Diagnosis not present

## 2020-04-26 DIAGNOSIS — M25511 Pain in right shoulder: Secondary | ICD-10-CM | POA: Diagnosis not present

## 2020-04-29 DIAGNOSIS — M25611 Stiffness of right shoulder, not elsewhere classified: Secondary | ICD-10-CM | POA: Diagnosis not present

## 2020-05-11 DIAGNOSIS — M25611 Stiffness of right shoulder, not elsewhere classified: Secondary | ICD-10-CM | POA: Diagnosis not present

## 2020-05-13 DIAGNOSIS — M25611 Stiffness of right shoulder, not elsewhere classified: Secondary | ICD-10-CM | POA: Diagnosis not present

## 2020-05-16 DIAGNOSIS — Z9889 Other specified postprocedural states: Secondary | ICD-10-CM | POA: Diagnosis not present

## 2020-05-19 DIAGNOSIS — M25511 Pain in right shoulder: Secondary | ICD-10-CM | POA: Diagnosis not present

## 2020-05-23 DIAGNOSIS — M25511 Pain in right shoulder: Secondary | ICD-10-CM | POA: Diagnosis not present

## 2020-05-30 DIAGNOSIS — M25511 Pain in right shoulder: Secondary | ICD-10-CM | POA: Diagnosis not present

## 2020-06-07 DIAGNOSIS — M25511 Pain in right shoulder: Secondary | ICD-10-CM | POA: Diagnosis not present

## 2020-06-13 DIAGNOSIS — M25611 Stiffness of right shoulder, not elsewhere classified: Secondary | ICD-10-CM | POA: Diagnosis not present

## 2020-06-27 DIAGNOSIS — Z9889 Other specified postprocedural states: Secondary | ICD-10-CM | POA: Diagnosis not present

## 2020-08-04 DIAGNOSIS — L814 Other melanin hyperpigmentation: Secondary | ICD-10-CM | POA: Diagnosis not present

## 2020-08-04 DIAGNOSIS — L738 Other specified follicular disorders: Secondary | ICD-10-CM | POA: Diagnosis not present

## 2020-08-04 DIAGNOSIS — D1801 Hemangioma of skin and subcutaneous tissue: Secondary | ICD-10-CM | POA: Diagnosis not present

## 2020-08-04 DIAGNOSIS — L821 Other seborrheic keratosis: Secondary | ICD-10-CM | POA: Diagnosis not present

## 2020-08-04 DIAGNOSIS — L820 Inflamed seborrheic keratosis: Secondary | ICD-10-CM | POA: Diagnosis not present

## 2020-08-04 DIAGNOSIS — L570 Actinic keratosis: Secondary | ICD-10-CM | POA: Diagnosis not present

## 2020-08-14 ENCOUNTER — Ambulatory Visit: Payer: 59 | Attending: Internal Medicine

## 2020-08-14 DIAGNOSIS — Z23 Encounter for immunization: Secondary | ICD-10-CM

## 2020-08-14 NOTE — Progress Notes (Signed)
   Covid-19 Vaccination Clinic  Name:  RIK WADEL    MRN: [id number] DOB: 10-15-1951  08/14/2020  Mr. Pietsch was observed post Covid-19 immunization for 15 minutes without incident. He was provided with Vaccine Information Sheet and instruction to access the V-Safe system.   Mr. Efferson was instructed to call 911 with any severe reactions post vaccine: Marland Kitchen Difficulty breathing  . Swelling of face and throat  . A fast heartbeat  . A bad rash all over body  . Dizziness and weakness

## 2020-08-16 DIAGNOSIS — Z125 Encounter for screening for malignant neoplasm of prostate: Secondary | ICD-10-CM | POA: Diagnosis not present

## 2020-08-16 DIAGNOSIS — N529 Male erectile dysfunction, unspecified: Secondary | ICD-10-CM | POA: Diagnosis not present

## 2020-08-16 DIAGNOSIS — Z0000 Encounter for general adult medical examination without abnormal findings: Secondary | ICD-10-CM | POA: Diagnosis not present

## 2020-08-18 DIAGNOSIS — L720 Epidermal cyst: Secondary | ICD-10-CM | POA: Diagnosis not present

## 2020-08-18 DIAGNOSIS — L821 Other seborrheic keratosis: Secondary | ICD-10-CM | POA: Diagnosis not present

## 2020-08-18 DIAGNOSIS — L570 Actinic keratosis: Secondary | ICD-10-CM | POA: Diagnosis not present

## 2020-08-19 DIAGNOSIS — N529 Male erectile dysfunction, unspecified: Secondary | ICD-10-CM | POA: Diagnosis not present

## 2020-08-19 DIAGNOSIS — M25511 Pain in right shoulder: Secondary | ICD-10-CM | POA: Diagnosis not present

## 2020-08-19 DIAGNOSIS — Z0000 Encounter for general adult medical examination without abnormal findings: Secondary | ICD-10-CM | POA: Diagnosis not present

## 2020-08-19 DIAGNOSIS — L570 Actinic keratosis: Secondary | ICD-10-CM | POA: Diagnosis not present

## 2020-08-19 DIAGNOSIS — R82998 Other abnormal findings in urine: Secondary | ICD-10-CM | POA: Diagnosis not present

## 2020-09-08 DIAGNOSIS — H6121 Impacted cerumen, right ear: Secondary | ICD-10-CM | POA: Diagnosis not present

## 2020-09-12 DIAGNOSIS — Z1212 Encounter for screening for malignant neoplasm of rectum: Secondary | ICD-10-CM | POA: Diagnosis not present

## 2020-10-14 DIAGNOSIS — Z20822 Contact with and (suspected) exposure to covid-19: Secondary | ICD-10-CM | POA: Diagnosis not present

## 2020-12-05 DIAGNOSIS — Z1152 Encounter for screening for COVID-19: Secondary | ICD-10-CM | POA: Diagnosis not present

## 2020-12-05 DIAGNOSIS — R059 Cough, unspecified: Secondary | ICD-10-CM | POA: Diagnosis not present

## 2020-12-05 DIAGNOSIS — J069 Acute upper respiratory infection, unspecified: Secondary | ICD-10-CM | POA: Diagnosis not present

## 2020-12-09 ENCOUNTER — Telehealth: Payer: Self-pay | Admitting: Pulmonary Disease

## 2020-12-12 ENCOUNTER — Ambulatory Visit: Payer: 59 | Admitting: Pulmonary Disease

## 2020-12-12 NOTE — Telephone Encounter (Signed)
Called and spoke with patient who states that he needs a new CPAP machine but in order to get a new machine he needs a new sleep study. Patient has not been seen since 01/2018. Advised patient that he would need to be seen in the office in order for this to be done. Patient expressed understanding. He is now scheduled with Aaron Edelman for 1/11 at 11:30. Nothing further needed at this time.

## 2020-12-13 ENCOUNTER — Other Ambulatory Visit: Payer: Self-pay | Admitting: Pulmonary Disease

## 2020-12-13 ENCOUNTER — Ambulatory Visit: Payer: 59 | Admitting: Pulmonary Disease

## 2020-12-13 ENCOUNTER — Encounter: Payer: Self-pay | Admitting: Pulmonary Disease

## 2020-12-13 ENCOUNTER — Other Ambulatory Visit: Payer: Self-pay

## 2020-12-13 VITALS — BP 122/80 | HR 70 | Temp 98.4°F | Ht 71.5 in | Wt 186.2 lb

## 2020-12-13 DIAGNOSIS — G4733 Obstructive sleep apnea (adult) (pediatric): Secondary | ICD-10-CM

## 2020-12-13 NOTE — Assessment & Plan Note (Signed)
Discussion: Sent message to [employer] Representative Melissa to further investigate why patient would need repeat sleep study  I suspect that patient may need repeat sleep study due to the fact that he did not obtain his CPAP originally via his insurance.  He paid for this out right.  I do not believe that is clinically necessary to repeat another sleep study if we have a documented sleep study showing that he has sleep apnea.  And if we have a compliance report showing that he is still compliant with using his CPAP.  Plan: Continue CPAP therapy Bring CPAP SD card into our office so we can obtain a download We will follow-up with you once we hear back from [employer] Follow-up in 1 year

## 2020-12-13 NOTE — Progress Notes (Addendum)
@Patient  ID: Richard Schneider, male    DOB: 06-02-1951, 70 y.o.   MRN: [id number]  Chief Complaint  Patient presents with  . Follow-up    OSA follow up     Referring provider: Velna Hatchet, MD  HPI:  70 year old male never smoker followed our office for obstructive sleep apnea  PMH: Bell's palsy, right shoulder pain Smoker/ Smoking History: Never smoker Maintenance: None Pt of: Richard Schneider  12/13/2020  - Visit   70 year old male never smoker followed in our office for obstructive sleep apnea.  Patient is established with Richard Schneider.  Last seen by Richard Schneider in February/2019.  Was recommended at that time they maintain CPAP therapy.  He was going to check with his dentist about whether or not he could be a candidate for an oral appliance.  And to follow-up in 6 months.  This was never completed.  Patient contact her office in January/2022 reporting that he would like to get a new CPAP machine.  Patient has not been seen since February/2019 so he was scheduled for follow-up.  Patient reports that he has been utilizing his CPAP.  Is trying to get the machine replaced.  He has had the same CPAP for close to 20 years.  He purchases out-of-pocket when he was diagnosed in 2001 with severe obstructive sleep apnea.  He has a SD card to show compliance he forgot to bring this.  He plans on bringing this in this week so we can obtain a compliance download.  He reports that he does wake up a few times a night to urinate as well as due to left shoulder pain.  He does not feel this is directly related to his CPAP.  He reports he uses CPAP every night.  He is trying to get this replaced and get a new CPAP machine.  His DME company [employer] instructing him that he needs to obtain another sleep study.  We will discuss this today.   Questionaires / Pulmonary Flowsheets:   ACT:  No flowsheet data found.  MMRC: No flowsheet data found.  Epworth:  Results of the Epworth flowsheet 01/24/2018  Sitting and  reading 0  Watching TV 1  Sitting, inactive in a public place (e.g. a theatre or a meeting) 0  As a passenger in a car for an hour without a break 0  Lying down to rest in the afternoon when circumstances permit 1  Sitting and talking to someone 0  Sitting quietly after a lunch without alcohol 0  In a car, while stopped for a few minutes in traffic 0  Total score 2    Tests:   07/29/2000 - sleep study - ahi 36  FENO:  No results found for: NITRICOXIDE  PFT: No flowsheet data found.  WALK:  No flowsheet data found.  Imaging: No results found.  Lab Results:  CBC No results found for: WBC, RBC, HGB, HCT, PLT, MCV, MCH, MCHC, RDW, LYMPHSABS, MONOABS, EOSABS, BASOSABS  BMET No results found for: NA, K, CL, CO2, GLUCOSE, BUN, CREATININE, CALCIUM, GFRNONAA, GFRAA  BNP No results found for: BNP  ProBNP No results found for: PROBNP  Specialty Problems      Pulmonary Problems   OSA (obstructive sleep apnea)    PSG 07/29/00 >> AHI 36, SpO2 low 55%; CPAP 8 cm H2O >> AHI 0.4         No Known Allergies  Immunization History  Administered Date(s) Administered  . Influenza Split 08/24/2017  .  PFIZER SARS-COV-2 Vaccination 08/14/2020    Past Medical History:  Diagnosis Date  . Carpal tunnel syndrome   . Carpal tunnel syndrome   . Cubital tunnel syndrome on left   . History of shingles   . OSA (obstructive sleep apnea)    uses CPAP nightly  . Sleep apnea    uses CPAP    Tobacco History: Social History   Tobacco Use  Smoking Status Never Smoker  Smokeless Tobacco Never Used   Counseling given: Yes   Continue to not smoke  No outpatient encounter medications on file as of 12/13/2020.   No facility-administered encounter medications on file as of 12/13/2020.     Review of Systems  Review of Systems  Constitutional: Negative for activity change, chills, fatigue, fever and unexpected weight change.  HENT: Negative for postnasal drip, rhinorrhea, sinus  pressure, sinus pain and sore throat.   Eyes: Negative.   Respiratory: Negative for cough, shortness of breath and wheezing.   Cardiovascular: Negative for chest pain and palpitations.  Gastrointestinal: Negative for constipation, diarrhea, nausea and vomiting.  Endocrine: Negative.   Genitourinary: Negative.   Musculoskeletal: Negative.   Skin: Negative.   Neurological: Negative for dizziness and headaches.  Psychiatric/Behavioral: Negative.  Negative for dysphoric mood. The patient is not nervous/anxious.   All other systems reviewed and are negative.    Physical Exam  BP 122/80 (BP Location: Left Arm, Cuff Size: Normal)   Pulse 70   Temp 98.4 F (36.9 C) (Oral)   Ht 5' 11.5" (1.816 m)   Wt 186 lb 3.2 oz (84.5 kg)   SpO2 98%   BMI 25.61 kg/m   Wt Readings from Last 5 Encounters:  12/13/20 186 lb 3.2 oz (84.5 kg)  02/05/20 183 lb (83 kg)  01/29/20 183 lb (83 kg)  06/06/18 182 lb (82.6 kg)  01/24/18 184 lb 12.8 oz (83.8 kg)    BMI Readings from Last 5 Encounters:  12/13/20 25.61 kg/m  02/05/20 24.99 kg/m  01/29/20 24.99 kg/m  06/06/18 25.38 kg/m  01/24/18 25.77 kg/m     Physical Exam Vitals and nursing note reviewed.  Constitutional:      General: He is not in acute distress.    Appearance: Normal appearance. He is normal weight.  HENT:     Head: Normocephalic and atraumatic.     Right Ear: Hearing and external ear normal.     Left Ear: Hearing and external ear normal.     Nose: Nose normal. No mucosal edema or rhinorrhea.     Right Turbinates: Not enlarged.     Left Turbinates: Not enlarged.     Mouth/Throat:     Mouth: Mucous membranes are dry.     Pharynx: Oropharynx is clear. No oropharyngeal exudate.  Eyes:     Pupils: Pupils are equal, round, and reactive to light.  Cardiovascular:     Rate and Rhythm: Normal rate and regular rhythm.     Pulses: Normal pulses.     Heart sounds: Normal heart sounds. No murmur heard.   Pulmonary:     Effort:  Pulmonary effort is normal.     Breath sounds: Normal breath sounds. No decreased breath sounds, wheezing or rales.  Musculoskeletal:     Cervical back: Normal range of motion.     Right lower leg: No edema.     Left lower leg: No edema.  Lymphadenopathy:     Cervical: No cervical adenopathy.  Skin:    General: Skin is warm and  dry.     Capillary Refill: Capillary refill takes less than 2 seconds.     Findings: No erythema or rash.  Neurological:     General: No focal deficit present.     Mental Status: He is alert and oriented to person, place, and time.     Motor: No weakness.     Coordination: Coordination normal.     Gait: Gait is intact. Gait normal.  Psychiatric:        Mood and Affect: Mood normal.        Behavior: Behavior normal. Behavior is cooperative.        Thought Content: Thought content normal.        Judgment: Judgment normal.       Assessment & Plan:   OSA (obstructive sleep apnea) Discussion: Sent message to [employer] Representative Melissa to further investigate why patient would need repeat sleep study  I suspect that patient may need repeat sleep study due to the fact that he did not obtain his CPAP originally via his insurance.  He paid for this out right.  I do not believe that is clinically necessary to repeat another sleep study if we have a documented sleep study showing that he has sleep apnea.  And if we have a compliance report showing that he is still compliant with using his CPAP.  Plan: Continue CPAP therapy Bring CPAP SD card into our office so we can obtain a download We will follow-up with you once we hear back from [employer] [facility] Follow-up in 1 year    Return in about 1 year (around 12/13/2021), or if symptoms worsen or fail to improve, for Follow up with Richard Schneider.   Addendum - 12/13/20 Received message from [facility] from [employer].  It is felt that the patient may not need additional testing.  Need to have documented compliance from SD  card.  Patient can provide this.  Left message for patient reporting this.  Addendum: 12/13/2020  Patient is dropped off CPAP compliance report is listed below:  11/13/2020-12/12/2020-CPAP compliance report-30 out of last 30 days used, all 30 days days greater than 4 hours, average usage 8 hours and 1 minute, AHI 1.6   Lauraine Rinne, NP 12/13/2020   This appointment required 25 minutes of patient care (this includes precharting, chart review, review of results, face-to-face care, etc.).

## 2020-12-13 NOTE — Progress Notes (Signed)
Reviewed and agree with assessment/plan.   Chesley Mires, MD [facility] Pulmonary/Critical Care 12/13/2020, 12:48 PM Pager:  [phone]

## 2020-12-13 NOTE — Patient Instructions (Addendum)
You were seen today by Lauraine Rinne, NP  for:   1. OSA (obstructive sleep apnea)  I have contacted [employer] representatives to further investigate if you actually need a repeat sleep study  Once I hear back from them we will discuss this.  If you do need to repeat sleep study Home sleep study within our practice should be sufficient  We recommend that you continue using your CPAP daily >>>Keep up the hard work using your device >>> Goal should be wearing this for the entire night that you are sleeping, at least 4 to 6 hours  Remember:  . Do not drive or operate heavy machinery if tired or drowsy.  . Please notify the supply company and office if you are unable to use your device regularly due to missing supplies or machine being broken.  . Work on maintaining a healthy weight and following your recommended nutrition plan  . Maintain proper daily exercise and movement  . Maintaining proper use of your device can also help improve management of other chronic illnesses such as: Blood pressure, blood sugars, and weight management.   BiPAP/ CPAP Cleaning:  >>>Clean weekly, with Dawn soap, and bottle brush.  Set up to air dry. >>> Wipe mask out daily with wet wipe or towelette   Follow Up:    Return in about 1 year (around 12/13/2021), or if symptoms worsen or fail to improve, for Follow up with Dr. Halford Chessman.   Notification of test results are managed in the following manner: If there are  any recommendations or changes to the  plan of care discussed in office today,  we will contact you and let you know what they are. If you do not hear from [place], then your results are normal and you can view them through your  MyChart account , or a letter will be sent to you. Thank you again for trusting [place] with your care  - Thank you, [facility]    It is flu season:   >>> Best ways to protect herself from the flu: Receive the yearly flu vaccine, practice good hand hygiene washing with soap and also  using hand sanitizer when available, eat a nutritious meals, get adequate rest, hydrate appropriately       Please contact the office if your symptoms worsen or you have concerns that you are not improving.   Thank you for choosing [facility] for your healthcare, and for allowing [place] to partner with you on your healthcare journey. I am thankful to be able to provide care to you today.   Wyn Quaker FNP-C

## 2021-01-03 DIAGNOSIS — R194 Change in bowel habit: Secondary | ICD-10-CM | POA: Diagnosis not present

## 2021-01-20 DIAGNOSIS — M9907 Segmental and somatic dysfunction of upper extremity: Secondary | ICD-10-CM | POA: Diagnosis not present

## 2021-01-31 DIAGNOSIS — M9907 Segmental and somatic dysfunction of upper extremity: Secondary | ICD-10-CM | POA: Diagnosis not present

## 2021-02-08 DIAGNOSIS — M9907 Segmental and somatic dysfunction of upper extremity: Secondary | ICD-10-CM | POA: Diagnosis not present

## 2021-02-15 DIAGNOSIS — M65332 Trigger finger, left middle finger: Secondary | ICD-10-CM | POA: Diagnosis not present

## 2021-02-17 DIAGNOSIS — M9907 Segmental and somatic dysfunction of upper extremity: Secondary | ICD-10-CM | POA: Diagnosis not present

## 2021-02-28 DIAGNOSIS — M25511 Pain in right shoulder: Secondary | ICD-10-CM | POA: Diagnosis not present

## 2021-03-01 DIAGNOSIS — Z4789 Encounter for other orthopedic aftercare: Secondary | ICD-10-CM | POA: Diagnosis not present

## 2021-03-01 DIAGNOSIS — M65332 Trigger finger, left middle finger: Secondary | ICD-10-CM | POA: Diagnosis not present

## 2021-09-05 ENCOUNTER — Other Ambulatory Visit: Payer: Self-pay | Admitting: Internal Medicine

## 2021-09-05 DIAGNOSIS — R17 Unspecified jaundice: Secondary | ICD-10-CM

## 2021-09-08 ENCOUNTER — Ambulatory Visit
Admission: RE | Admit: 2021-09-08 | Discharge: 2021-09-08 | Disposition: A | Discharge status: Home / Self Care | Payer: Managed Care, Other (non HMO) | Source: Ambulatory Visit | Attending: Internal Medicine | Admitting: Internal Medicine

## 2021-09-08 DIAGNOSIS — R17 Unspecified jaundice: Secondary | ICD-10-CM

## 2021-09-20 ENCOUNTER — Other Ambulatory Visit: Payer: Self-pay | Admitting: Internal Medicine

## 2021-09-20 DIAGNOSIS — K7689 Other specified diseases of liver: Secondary | ICD-10-CM

## 2021-09-22 ENCOUNTER — Ambulatory Visit
Admission: RE | Admit: 2021-09-22 | Discharge: 2021-09-22 | Disposition: A | Discharge status: Home / Self Care | Payer: Managed Care, Other (non HMO) | Source: Ambulatory Visit | Attending: Internal Medicine | Admitting: Internal Medicine

## 2021-09-22 DIAGNOSIS — K7689 Other specified diseases of liver: Secondary | ICD-10-CM

## 2021-09-22 MED ORDER — GADOBENATE DIMEGLUMINE 529 MG/ML IV SOLN
17.0000 mL | Freq: Once | INTRAVENOUS | Status: AC | PRN
  Administered 2021-09-22: 17 mL via INTRAVENOUS

## 2021-09-24 ENCOUNTER — Other Ambulatory Visit: Payer: Managed Care, Other (non HMO)

## 2022-01-29 ENCOUNTER — Telehealth: Payer: Self-pay | Admitting: Pulmonary Disease

## 2022-01-29 NOTE — Telephone Encounter (Signed)
Patient would like to know if Dr.Sood, recommends airing the first Hoseless, Micro CPAP. Informational packet placed in Dr Halford Chessman , box.

## 2022-01-30 NOTE — Telephone Encounter (Signed)
Dr Halford Chessman Please advise:  Patient would like to know if Dr.Sood, recommends airing the first Hoseless, Micro CPAP. Informational packet placed in Dr Halford Chessman , box.

## 2022-01-30 NOTE — Telephone Encounter (Signed)
I am familiar with these devices.  To my knowledge these devices have not yet received approval by the FDA for medical therapy of obstructive sleep apnea.  As such it is uncertain whether this would be effective treatment for his sleep apnea.

## 2022-01-31 NOTE — Telephone Encounter (Signed)
Spoke with pt and passed along Dr. Juanetta Gosling message. Pt stated understanding. Nothing further needed at this time.  ?

## 2022-04-19 DIAGNOSIS — L570 Actinic keratosis: Secondary | ICD-10-CM | POA: Diagnosis not present

## 2022-05-30 DIAGNOSIS — M7552 Bursitis of left shoulder: Secondary | ICD-10-CM | POA: Diagnosis not present

## 2022-05-30 DIAGNOSIS — M7542 Impingement syndrome of left shoulder: Secondary | ICD-10-CM | POA: Diagnosis not present

## 2022-05-30 DIAGNOSIS — M25512 Pain in left shoulder: Secondary | ICD-10-CM | POA: Diagnosis not present

## 2022-08-24 DIAGNOSIS — L570 Actinic keratosis: Secondary | ICD-10-CM | POA: Diagnosis not present

## 2022-09-04 DIAGNOSIS — Z0000 Encounter for general adult medical examination without abnormal findings: Secondary | ICD-10-CM | POA: Diagnosis not present

## 2022-09-05 DIAGNOSIS — Z125 Encounter for screening for malignant neoplasm of prostate: Secondary | ICD-10-CM | POA: Diagnosis not present

## 2022-09-14 DIAGNOSIS — Z23 Encounter for immunization: Secondary | ICD-10-CM | POA: Diagnosis not present

## 2022-09-14 DIAGNOSIS — Z0000 Encounter for general adult medical examination without abnormal findings: Secondary | ICD-10-CM | POA: Diagnosis not present

## 2022-09-19 DIAGNOSIS — S30811A Abrasion of abdominal wall, initial encounter: Secondary | ICD-10-CM | POA: Diagnosis not present

## 2022-11-20 DIAGNOSIS — M160 Bilateral primary osteoarthritis of hip: Secondary | ICD-10-CM | POA: Diagnosis not present

## 2022-12-17 DIAGNOSIS — M65332 Trigger finger, left middle finger: Secondary | ICD-10-CM | POA: Diagnosis not present

## 2022-12-17 DIAGNOSIS — M25522 Pain in left elbow: Secondary | ICD-10-CM | POA: Diagnosis not present

## 2022-12-17 DIAGNOSIS — M25562 Pain in left knee: Secondary | ICD-10-CM | POA: Diagnosis not present

## 2022-12-24 DIAGNOSIS — H469 Unspecified optic neuritis: Secondary | ICD-10-CM | POA: Diagnosis not present

## 2023-05-01 IMAGING — MR MR ABDOMEN WO/W CM
11 of 17 series · 28 of 48 positions shown · IV contrast (17ml Multihance)
Comparison: None.

CLINICAL DATA: Abnormality on recent sonogram in a 70-year-old male
who presents with bloating and elevated liver function tests.

EXAM:
MRI ABDOMEN WITHOUT AND WITH CONTRAST
TECHNIQUE: Multiplanar multisequence MR imaging of the abdomen was performed
both before and after the administration of intravenous contrast.
CONTRAST:  17mL MULTIHANCE GADOBENATE DIMEGLUMINE 529 MG/ML IV SOLN

[Series 3: cor haste · coronal · 5.0mm · 0.68mm/px · 2 of 32 slices shown]
[im 1/32]
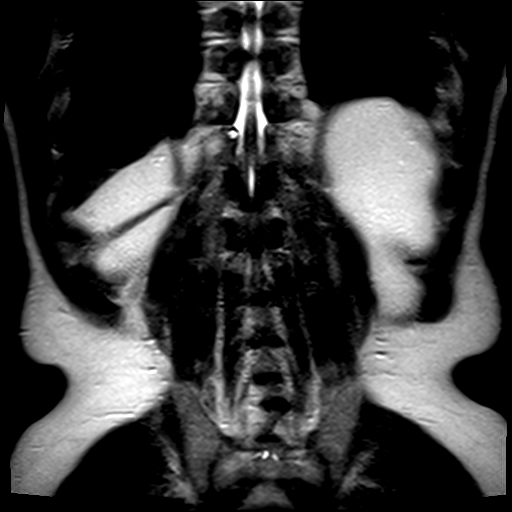
[im 32/32]
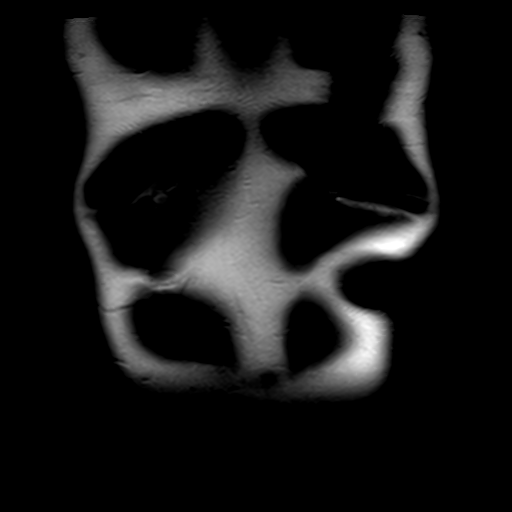

[Series 4: axial haste · axial · 6.0mm · 0.68mm/px · z∈[-150,+62]mm · 2 of 33 slices shown]
[im 1/33]
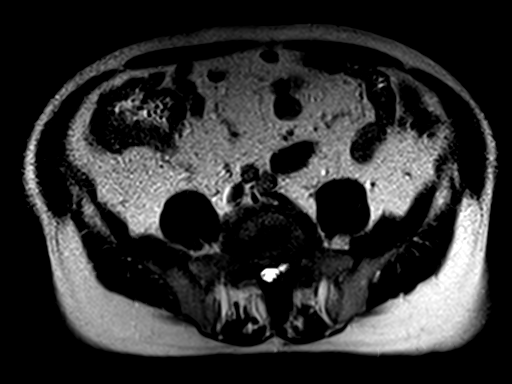
[im 33/33]
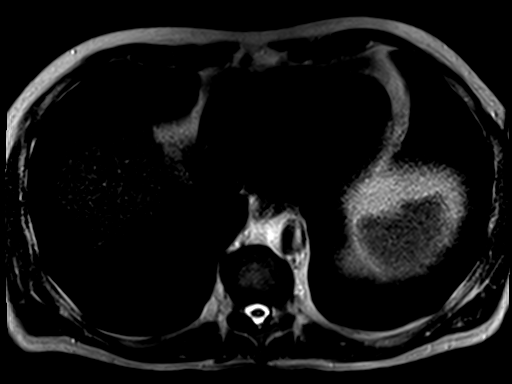

[Series 5: T1 · axial · 6.0mm · 0.68mm/px · z∈[-147,+65]mm · 4 of 66 slices shown]
[im 1/66]
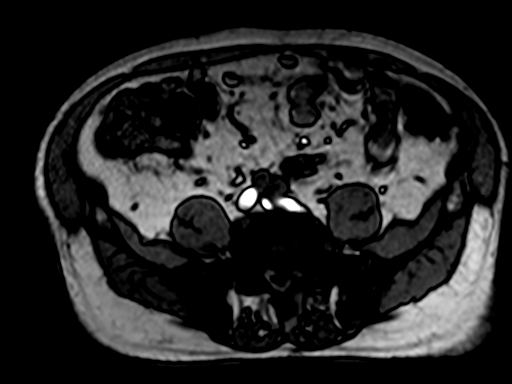
[im 22/66]
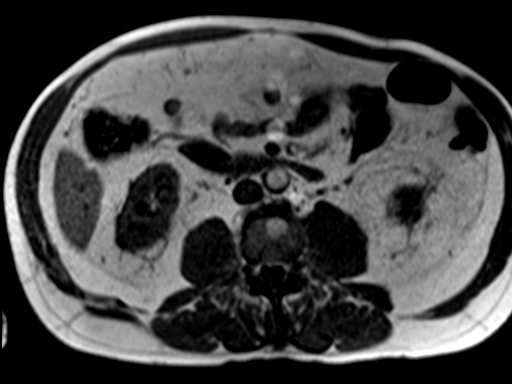
[im 44/66]
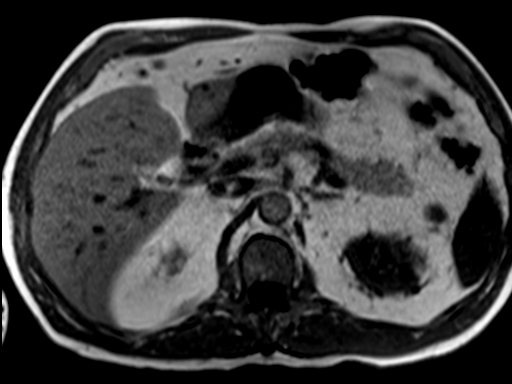
[im 66/66]
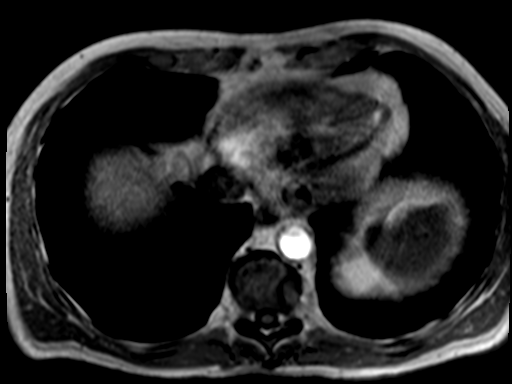

[Series 6: bSSFP · axial · 4.0mm · 0.68mm/px · z∈[-162,+78]mm · 3 of 61 slices shown]
[im 1/61]
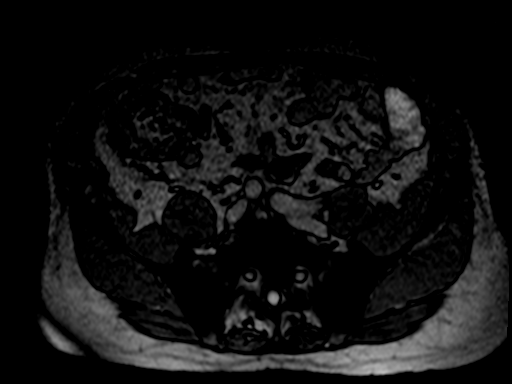
[im 31/61]
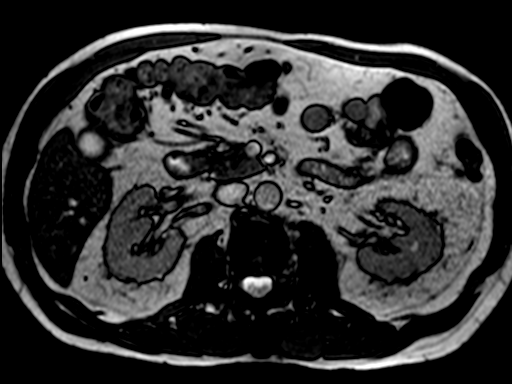
[im 61/61]
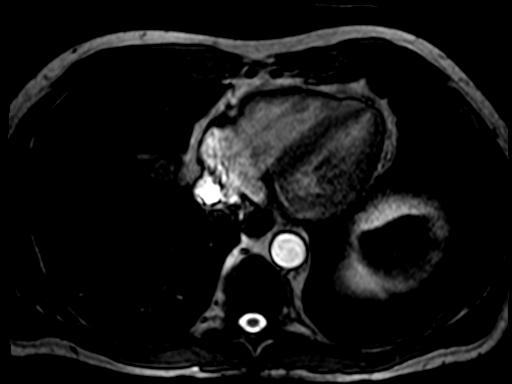

[Series 7: T2 fat-sat · axial · 6.0mm · 1.09mm/px · z∈[-86,+137]mm · 2 of 32 slices shown]
[im 1/32]
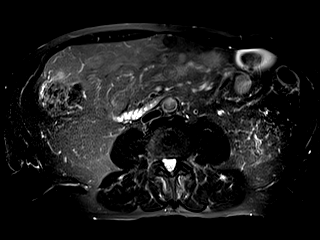
[im 32/32]
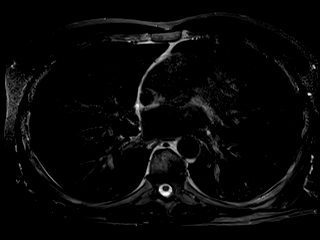

[Series 8: [id number]_trig · axial · 6.0mm · 1.82mm/px · z∈[-89,+135]mm · 4 of 94 slices shown]
[im 1/94]
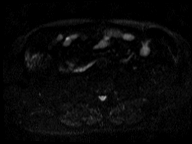
[im 32/94]
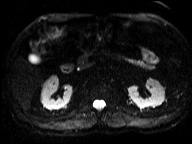
[im 63/94]
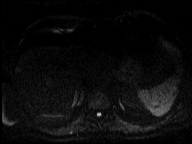
[im 94/94]
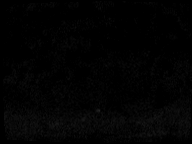

[Series 9: [id number]_trig_adc · axial · 6.0mm · 1.82mm/px · 1 of 32 slices shown]
[im 1/32]
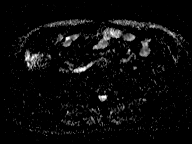

[Series 10: T1 dynamic · axial · non-contrast · 2.5mm · 0.74mm/px · z∈[-119,+98]mm · 3 of 88 slices shown]
[im 1/88]
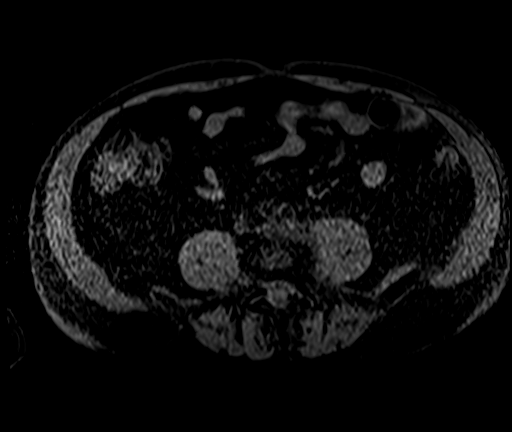
[im 44/88]
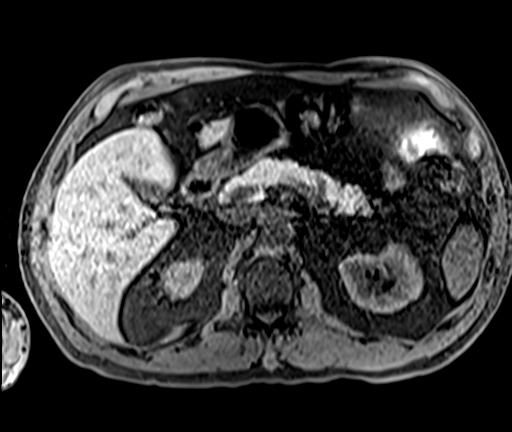
[im 88/88]
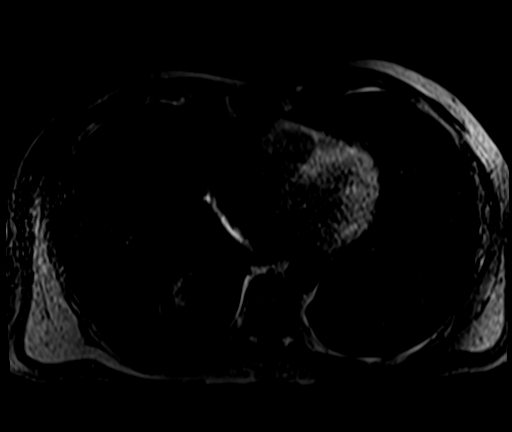

[Series 11: T1 dynamic post-contrast · axial · 2.5mm · 0.74mm/px · z∈[-119,+98]mm · 3 of 88 slices shown (1 of 3)]
[im 1/88]
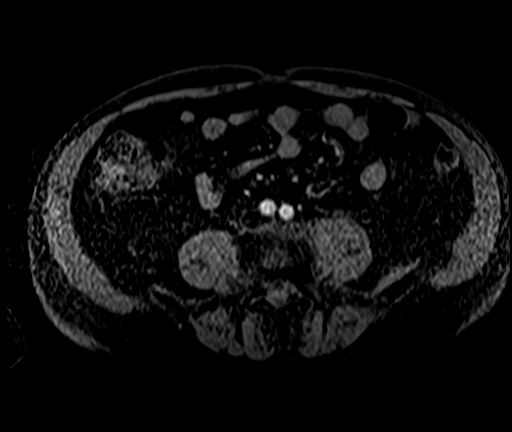
[im 44/88]
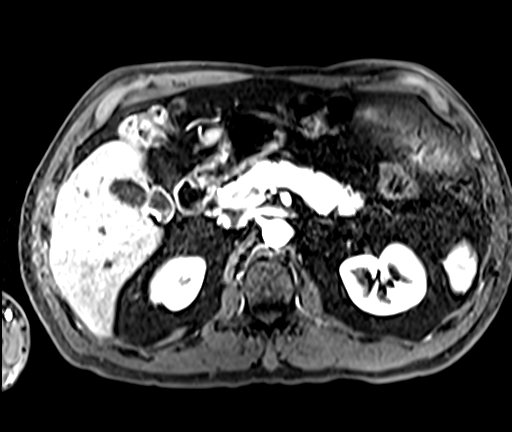
[im 88/88]
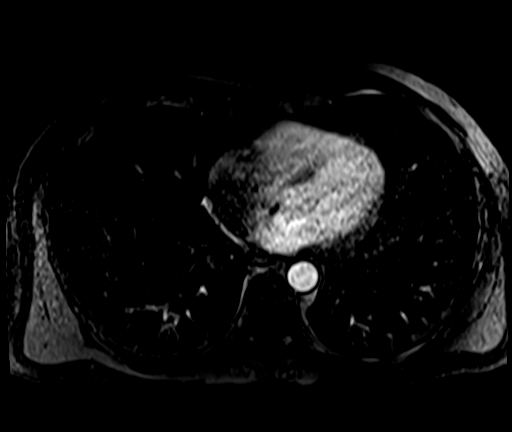

[Series 12: T1 dynamic post-contrast · axial · 2.5mm · 0.74mm/px · z∈[-119,+98]mm · 3 of 88 slices shown (2 of 3)]
[im 1/88]
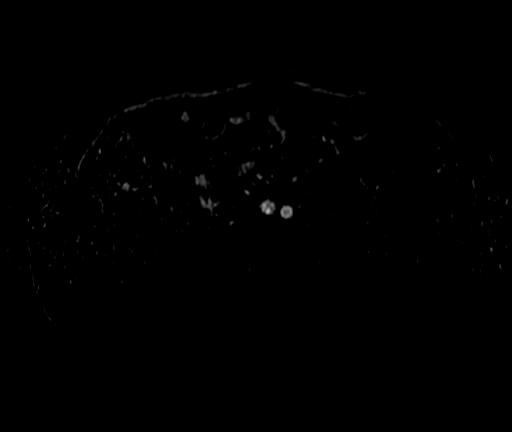
[im 44/88]
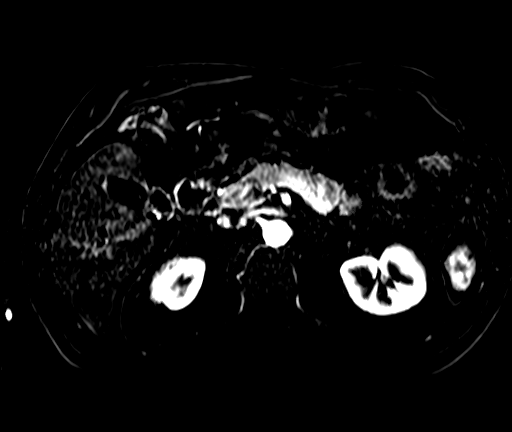
[im 88/88]
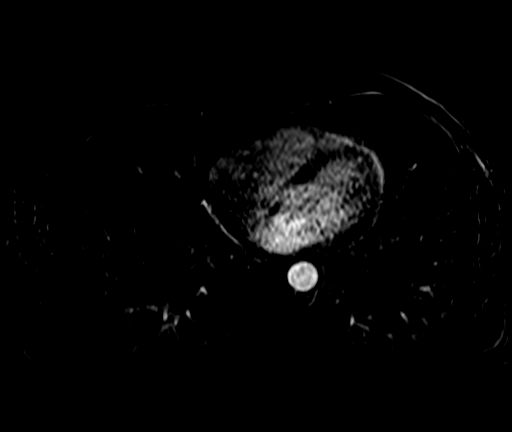

[Series 13: T1 dynamic post-contrast · axial · 2.5mm · 0.74mm/px · 1 of 88 slices shown (3 of 3)]
[im 1/88]
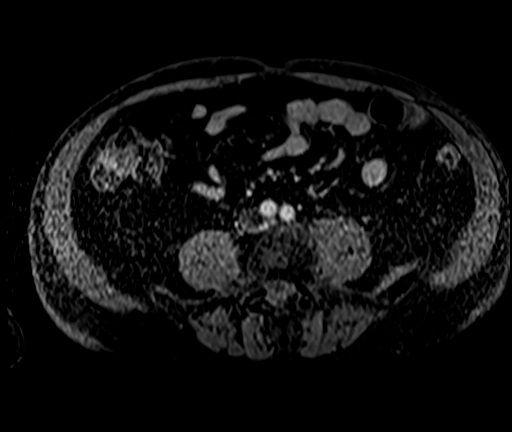

[28 of 48 positions shown; findings below may reference images not displayed]

FINDINGS: Lower chest: Lung bases without effusion or consolidation, limited
assessment on hepatic MRI.

Hepatobiliary: Mild hepatic steatosis. Mild sparing about the
gallbladder fossa. No focal, suspicious hepatic lesion. The portal
vein is patent. Hepatic veins are patent. Cysts are present in the
liver largest in the RIGHT hemi liver in the central RIGHT hemi
liver on image 36 of series 13 measuring 2.6 x 2.2 cm. No
pericholecystic stranding. No biliary duct dilation.

Pancreas: Normal intrinsic T1 signal. No ductal dilation or sign of
inflammation. No focal lesion.

Spleen:  Normal size and contour without focal lesion.

Adrenals/Urinary Tract: Adrenal glands are normal. No suspicious
renal lesion, hydronephrosis or perinephric stranding.

Stomach/Bowel: Unremarkable to the extent evaluated on abdominal
MRI.

Vascular/Lymphatic: No pathologically enlarged lymph nodes
identified. No abdominal aortic aneurysm demonstrated.

Other:  No ascites.

Musculoskeletal: No suspicious bone lesions identified.
IMPRESSION: Mild hepatic steatosis with mild sparing about the gallbladder
fossa. No focal, suspicious hepatic lesion.

Hepatic cysts.

## 2023-06-17 DIAGNOSIS — M65332 Trigger finger, left middle finger: Secondary | ICD-10-CM | POA: Diagnosis not present

## 2023-06-17 DIAGNOSIS — M25562 Pain in left knee: Secondary | ICD-10-CM | POA: Diagnosis not present

## 2023-06-17 DIAGNOSIS — M25522 Pain in left elbow: Secondary | ICD-10-CM | POA: Diagnosis not present

## 2023-07-01 DIAGNOSIS — M65332 Trigger finger, left middle finger: Secondary | ICD-10-CM | POA: Diagnosis not present

## 2023-07-01 DIAGNOSIS — M25522 Pain in left elbow: Secondary | ICD-10-CM | POA: Diagnosis not present

## 2023-07-01 DIAGNOSIS — M7542 Impingement syndrome of left shoulder: Secondary | ICD-10-CM | POA: Diagnosis not present

## 2023-07-01 DIAGNOSIS — M25562 Pain in left knee: Secondary | ICD-10-CM | POA: Diagnosis not present

## 2023-08-28 DIAGNOSIS — Z23 Encounter for immunization: Secondary | ICD-10-CM | POA: Diagnosis not present

## 2023-08-29 DIAGNOSIS — M25562 Pain in left knee: Secondary | ICD-10-CM | POA: Diagnosis not present

## 2023-08-29 DIAGNOSIS — M25512 Pain in left shoulder: Secondary | ICD-10-CM | POA: Diagnosis not present

## 2023-08-30 DIAGNOSIS — L814 Other melanin hyperpigmentation: Secondary | ICD-10-CM | POA: Diagnosis not present

## 2023-08-30 DIAGNOSIS — D225 Melanocytic nevi of trunk: Secondary | ICD-10-CM | POA: Diagnosis not present

## 2023-08-30 DIAGNOSIS — L821 Other seborrheic keratosis: Secondary | ICD-10-CM | POA: Diagnosis not present

## 2023-08-30 DIAGNOSIS — L570 Actinic keratosis: Secondary | ICD-10-CM | POA: Diagnosis not present

## 2023-09-20 DIAGNOSIS — Z1389 Encounter for screening for other disorder: Secondary | ICD-10-CM | POA: Diagnosis not present

## 2023-09-26 DIAGNOSIS — Z0000 Encounter for general adult medical examination without abnormal findings: Secondary | ICD-10-CM | POA: Diagnosis not present

## 2023-09-26 DIAGNOSIS — Z1331 Encounter for screening for depression: Secondary | ICD-10-CM | POA: Diagnosis not present

## 2023-09-26 DIAGNOSIS — K7689 Other specified diseases of liver: Secondary | ICD-10-CM | POA: Diagnosis not present

## 2023-09-26 DIAGNOSIS — Z1339 Encounter for screening examination for other mental health and behavioral disorders: Secondary | ICD-10-CM | POA: Diagnosis not present

## 2023-09-27 DIAGNOSIS — M1712 Unilateral primary osteoarthritis, left knee: Secondary | ICD-10-CM | POA: Diagnosis not present

## 2023-11-22 DIAGNOSIS — M160 Bilateral primary osteoarthritis of hip: Secondary | ICD-10-CM | POA: Diagnosis not present

## 2023-11-22 DIAGNOSIS — M1712 Unilateral primary osteoarthritis, left knee: Secondary | ICD-10-CM | POA: Diagnosis not present

## 2023-12-12 DIAGNOSIS — M1612 Unilateral primary osteoarthritis, left hip: Secondary | ICD-10-CM | POA: Diagnosis not present

## 2024-01-09 DIAGNOSIS — Z79899 Other long term (current) drug therapy: Secondary | ICD-10-CM | POA: Diagnosis not present

## 2024-01-10 DIAGNOSIS — Z96642 Presence of left artificial hip joint: Secondary | ICD-10-CM | POA: Diagnosis not present

## 2024-01-10 DIAGNOSIS — Z471 Aftercare following joint replacement surgery: Secondary | ICD-10-CM | POA: Diagnosis not present

## 2024-01-20 DIAGNOSIS — M1611 Unilateral primary osteoarthritis, right hip: Secondary | ICD-10-CM | POA: Diagnosis not present

## 2024-01-26 NOTE — Progress Notes (Unsigned)
 01/28/24- 72 yoM followed for OSA, complicated by Dyshagia, TMJ dysfunction,  NPSG 07/29/00 [facility], on file in "Media")  AHI 36.1/hr, desat to 87%, body weight 176 lbs CPAP 8/ [employer]-   replaced 2022  [place] II with nasal pillows mask. Does not use humidifier. Download compliance   not available. "Can't sleep without it". Body weight today-195.8 lbs Discussed the use of AI scribe software for clinical note transcription with the patient, who gave verbal consent to proceed. He accepts change to autopap 5-15 after today's visit. Had consecutive hip replacements recently- doing well, but still using cane. History of Present Illness   The patient, with a history of sleep apnea, presents for a follow-up visit. He reports being comfortable with his current CPAP machine set at a fixed pressure of eight and using a minimal pillow mask. The patient mentions that he has had recent hip surgery, which is why he is wearing elastic stockings. He expresses satisfaction with his CPAP machine and notes that life is better with it than without it. The patient also expresses interest in alternatives for when the electricity goes out, such as mouth guards and chin straps. He also mentions considering a travel CPAP machine for convenience during travel or power outages. Alternatives to CPAP were reviewed.     Prior to Admission medications   Medication Sig Start Date End Date Taking? Authorizing Provider  aspirin 81 MG chewable tablet Chew 81 mg by mouth 2 (two) times daily. 12/08/23  Yes [provider]  oxyCODONE (OXY IR/ROXICODONE) 5 MG immediate release tablet Take 5 mg by mouth every 6 (six) hours as needed for moderate pain (pain score 4-6). 12/08/23  Yes [provider]  tiZANidine (ZANAFLEX) 2 MG tablet Take 2 mg by mouth every 6 (six) hours as needed for muscle spasms. 01/25/24  Yes [provider]   Past Medical History:  Diagnosis Date   Carpal tunnel syndrome    Carpal tunnel  syndrome    Cubital tunnel syndrome on left    History of shingles    OSA (obstructive sleep apnea)    uses CPAP nightly   Sleep apnea    uses CPAP   Past Surgical History:  Procedure Laterality Date   BACK SURGERY     x2   CARPAL TUNNEL WITH CUBITAL TUNNEL Right    CERVICAL FUSION     COLOSTOMY     >10 yrs   NECK SURGERY     SHOULDER SURGERY     Rotary cuff rt   Family History  Problem Relation Age of Onset   Heart block Mother    Cancer Maternal Grandmother    Colon cancer Neg Hx    Colon polyps Neg Hx    Esophageal cancer Neg Hx    Rectal cancer Neg Hx    Stomach cancer Neg Hx    Social History   Socioeconomic History   Marital status: Married    Spouse name: Not on file   Number of children: Not on file   Years of education: Not on file   Highest education level: Not on file  Occupational History   Not on file  Tobacco Use   Smoking status: Never   Smokeless tobacco: Never  Vaping Use   Vaping status: Never Used  Substance and Sexual Activity   Alcohol use: Yes    Alcohol/week: 5.0 standard drinks of alcohol    Types: 3 Glasses of wine, 2 Shots of liquor per week  Comment: social   Drug use: No   Sexual activity: Not on file  Other Topics Concern   Not on file  Social History Narrative   Not on file   Social Drivers of Health   Financial Resource Strain: Not on file  Food Insecurity: Not on file  Transportation Needs: Not on file  Physical Activity: Not on file  Stress: Not on file  Social Connections: Not on file  Intimate Partner Violence: Not on file   ROS-see HPI   + = positive Constitutional:    weight loss, night sweats, fevers, chills, fatigue, lassitude. HEENT:    headaches, difficulty swallowing, tooth/dental problems, sore throat,       sneezing, itching, ear ache, nasal congestion, post nasal drip, snoring CV:    chest pain, orthopnea, PND, swelling in lower extremities, anasarca,                                   dizziness,  palpitations Resp:   shortness of breath with exertion or at rest.                productive cough,   non-productive cough, coughing up of blood.              change in color of mucus.  wheezing.   Skin:    rash or lesions. GI:  No-   heartburn, indigestion, abdominal pain, nausea, vomiting, diarrhea,                 change in bowel habits, loss of appetite GU: dysuria, change in color of urine, no urgency or frequency.   flank pain. MS:   +joint pain, stiffness, decreased range of motion, back pain. Neuro-     nothing unusual Psych:  change in mood or affect.  depression or anxiety.   memory loss.  OBJ- Physical Exam General- Alert, Oriented, Affect-appropriate, Distress- none acute, not obese Skin- rash-none, lesions- none, excoriation- none Lymphadenopathy- none Head- atraumatic            Eyes- Gross vision intact, PERRLA, conjunctivae and secretions clear            Ears- Hearing, canals-normal            Nose- Clear, no-Septal dev, mucus, polyps, erosion, perforation             Throat- Mallampati II-III , mucosa clear , drainage- none, tonsils- atrophic, +teeth Neck- flexible , trachea midline, no stridor , thyroid nl, carotid no bruit Chest - symmetrical excursion , unlabored           Heart/CV- RRR , no murmur , no gallop  , no rub, nl s1 s2                           - JVD- none , edema- none, stasis changes- none, varices- none           Lung- clear to P&A, wheeze- none, cough- none , dullness-none, rub- none           Chest wall-  Abd-  Br/ Gen/ Rectal- Not done, not indicated Extrem- cyanosis- none, clubbing, none, atrophy- none, strength- nl Neuro- grossly intact to observation  Assessment and Plan:    Obstructive Sleep Apnea Patient is comfortable with current CPAP settings and mask. Discussed the benefits of autoPAP and patient agreed to try it. Patient also  expressed interest in alternative options for when electricity is unavailable. -Change CPAP settings to  autoPAP with a range of 5-15 cm H2O. -Order replacement CPAP supplies as needed. -Consider over-the-counter options such as chin straps or mouthpieces for temporary use. -Consider travel CPAP machine for travel or power outages. Sent prescription to DME provider.  General Health Maintenance Patient recently had hip surgery and is recovering well. No current need for medications. -Continue current management.

## 2024-01-28 ENCOUNTER — Ambulatory Visit: Payer: BC Managed Care – PPO | Admitting: Internal Medicine

## 2024-01-28 ENCOUNTER — Encounter: Payer: Self-pay | Admitting: Internal Medicine

## 2024-01-28 VITALS — BP 140/60 | HR 85 | Temp 97.8°F | Ht 72.0 in | Wt 195.8 lb

## 2024-01-28 DIAGNOSIS — G4733 Obstructive sleep apnea (adult) (pediatric): Secondary | ICD-10-CM | POA: Diagnosis not present

## 2024-01-28 NOTE — Patient Instructions (Addendum)
 Order- DME [employer]- please install Care everywhere download software/ SD card Replace mask of choice, supplies, hose, filters as needed    Change to autopap 5-15. Also provide travel cpap AUTO 5-15, mask, hoses, supplies, filters

## 2024-02-06 ENCOUNTER — Telehealth: Payer: Self-pay | Admitting: Internal Medicine

## 2024-02-07 NOTE — Telephone Encounter (Signed)
 Message sent to [employer] to find out where we are on this order

## 2024-02-12 DIAGNOSIS — G4733 Obstructive sleep apnea (adult) (pediatric): Secondary | ICD-10-CM | POA: Diagnosis not present

## 2024-02-12 DIAGNOSIS — R058 Other specified cough: Secondary | ICD-10-CM | POA: Diagnosis not present

## 2024-02-12 DIAGNOSIS — R0602 Shortness of breath: Secondary | ICD-10-CM | POA: Diagnosis not present

## 2024-02-13 NOTE — Telephone Encounter (Signed)
 The settings order was changed on 02-04-23 and the supply order is in process.  Thank you,  Luellen Pucker

## 2024-02-14 NOTE — Telephone Encounter (Signed)
Was the patient made aware of this?

## 2024-02-14 NOTE — Telephone Encounter (Signed)
 Called and let patient know order has been sent over to supply dept and its in process

## 2024-02-18 ENCOUNTER — Encounter: Payer: Self-pay | Admitting: Internal Medicine

## 2024-02-26 ENCOUNTER — Telehealth: Payer: Self-pay

## 2024-02-26 NOTE — Telephone Encounter (Signed)
 PT calling [facility] asking for [employer] reps name and number. I am assuming his order has not yet arrived.  Please call PT to advise. [phone]

## 2024-02-26 NOTE — Telephone Encounter (Signed)
 Spoke with patient regarding prior message. Patient stated he needed to get in touch with [employer] to find out when patient is getting a new CPAP mask . Gave patient [employer]'s number [phone].Patient's voice was understanding.Nothing else further needed.

## 2024-03-04 DIAGNOSIS — Z96643 Presence of artificial hip joint, bilateral: Secondary | ICD-10-CM | POA: Diagnosis not present

## 2024-03-25 DIAGNOSIS — M25512 Pain in left shoulder: Secondary | ICD-10-CM | POA: Diagnosis not present

## 2024-04-07 DIAGNOSIS — M25612 Stiffness of left shoulder, not elsewhere classified: Secondary | ICD-10-CM | POA: Diagnosis not present

## 2024-04-07 DIAGNOSIS — M25512 Pain in left shoulder: Secondary | ICD-10-CM | POA: Diagnosis not present

## 2024-04-16 DIAGNOSIS — M25512 Pain in left shoulder: Secondary | ICD-10-CM | POA: Diagnosis not present

## 2024-04-17 DIAGNOSIS — M25612 Stiffness of left shoulder, not elsewhere classified: Secondary | ICD-10-CM | POA: Diagnosis not present

## 2024-04-17 DIAGNOSIS — M25512 Pain in left shoulder: Secondary | ICD-10-CM | POA: Diagnosis not present

## 2024-04-22 DIAGNOSIS — M25612 Stiffness of left shoulder, not elsewhere classified: Secondary | ICD-10-CM | POA: Diagnosis not present

## 2024-04-22 DIAGNOSIS — M25512 Pain in left shoulder: Secondary | ICD-10-CM | POA: Diagnosis not present

## 2024-04-23 DIAGNOSIS — M25512 Pain in left shoulder: Secondary | ICD-10-CM | POA: Diagnosis not present

## 2024-04-29 DIAGNOSIS — M25612 Stiffness of left shoulder, not elsewhere classified: Secondary | ICD-10-CM | POA: Diagnosis not present

## 2024-04-29 DIAGNOSIS — M25512 Pain in left shoulder: Secondary | ICD-10-CM | POA: Diagnosis not present

## 2024-05-01 ENCOUNTER — Encounter: Payer: Self-pay | Admitting: Internal Medicine

## 2024-05-18 DIAGNOSIS — M7021 Olecranon bursitis, right elbow: Secondary | ICD-10-CM | POA: Diagnosis not present

## 2024-05-19 DIAGNOSIS — M25512 Pain in left shoulder: Secondary | ICD-10-CM | POA: Diagnosis not present

## 2024-05-19 DIAGNOSIS — M25612 Stiffness of left shoulder, not elsewhere classified: Secondary | ICD-10-CM | POA: Diagnosis not present

## 2024-06-22 DIAGNOSIS — R7989 Other specified abnormal findings of blood chemistry: Secondary | ICD-10-CM | POA: Diagnosis not present

## 2024-07-09 DIAGNOSIS — M25562 Pain in left knee: Secondary | ICD-10-CM | POA: Diagnosis not present

## 2024-07-09 DIAGNOSIS — M1712 Unilateral primary osteoarthritis, left knee: Secondary | ICD-10-CM | POA: Diagnosis not present

## 2024-08-13 DIAGNOSIS — G8918 Other acute postprocedural pain: Secondary | ICD-10-CM | POA: Diagnosis not present

## 2024-08-13 DIAGNOSIS — M1712 Unilateral primary osteoarthritis, left knee: Secondary | ICD-10-CM | POA: Diagnosis not present

## 2024-08-17 DIAGNOSIS — M25562 Pain in left knee: Secondary | ICD-10-CM | POA: Diagnosis not present

## 2024-08-17 DIAGNOSIS — M25662 Stiffness of left knee, not elsewhere classified: Secondary | ICD-10-CM | POA: Diagnosis not present

## 2024-08-19 DIAGNOSIS — M25662 Stiffness of left knee, not elsewhere classified: Secondary | ICD-10-CM | POA: Diagnosis not present

## 2024-08-19 DIAGNOSIS — M25562 Pain in left knee: Secondary | ICD-10-CM | POA: Diagnosis not present

## 2024-08-21 DIAGNOSIS — M25662 Stiffness of left knee, not elsewhere classified: Secondary | ICD-10-CM | POA: Diagnosis not present

## 2024-08-24 DIAGNOSIS — M25562 Pain in left knee: Secondary | ICD-10-CM | POA: Diagnosis not present

## 2024-08-24 DIAGNOSIS — M25662 Stiffness of left knee, not elsewhere classified: Secondary | ICD-10-CM | POA: Diagnosis not present

## 2024-08-27 DIAGNOSIS — M25562 Pain in left knee: Secondary | ICD-10-CM | POA: Diagnosis not present

## 2024-08-27 DIAGNOSIS — M25662 Stiffness of left knee, not elsewhere classified: Secondary | ICD-10-CM | POA: Diagnosis not present

## 2024-08-28 DIAGNOSIS — M25662 Stiffness of left knee, not elsewhere classified: Secondary | ICD-10-CM | POA: Diagnosis not present

## 2024-08-28 DIAGNOSIS — M25562 Pain in left knee: Secondary | ICD-10-CM | POA: Diagnosis not present

## 2024-08-31 DIAGNOSIS — M25662 Stiffness of left knee, not elsewhere classified: Secondary | ICD-10-CM | POA: Diagnosis not present

## 2024-08-31 DIAGNOSIS — M25562 Pain in left knee: Secondary | ICD-10-CM | POA: Diagnosis not present

## 2024-09-01 DIAGNOSIS — M25662 Stiffness of left knee, not elsewhere classified: Secondary | ICD-10-CM | POA: Diagnosis not present

## 2024-09-01 DIAGNOSIS — M25562 Pain in left knee: Secondary | ICD-10-CM | POA: Diagnosis not present

## 2024-09-03 DIAGNOSIS — M25662 Stiffness of left knee, not elsewhere classified: Secondary | ICD-10-CM | POA: Diagnosis not present

## 2024-09-03 DIAGNOSIS — M25562 Pain in left knee: Secondary | ICD-10-CM | POA: Diagnosis not present

## 2024-09-08 DIAGNOSIS — M25662 Stiffness of left knee, not elsewhere classified: Secondary | ICD-10-CM | POA: Diagnosis not present

## 2024-09-08 DIAGNOSIS — M25562 Pain in left knee: Secondary | ICD-10-CM | POA: Diagnosis not present

## 2024-09-10 DIAGNOSIS — M25562 Pain in left knee: Secondary | ICD-10-CM | POA: Diagnosis not present

## 2024-09-10 DIAGNOSIS — M25662 Stiffness of left knee, not elsewhere classified: Secondary | ICD-10-CM | POA: Diagnosis not present

## 2024-09-11 DIAGNOSIS — L570 Actinic keratosis: Secondary | ICD-10-CM | POA: Diagnosis not present

## 2024-09-11 DIAGNOSIS — D1801 Hemangioma of skin and subcutaneous tissue: Secondary | ICD-10-CM | POA: Diagnosis not present

## 2024-09-11 DIAGNOSIS — L814 Other melanin hyperpigmentation: Secondary | ICD-10-CM | POA: Diagnosis not present

## 2024-09-11 DIAGNOSIS — L821 Other seborrheic keratosis: Secondary | ICD-10-CM | POA: Diagnosis not present

## 2024-09-15 DIAGNOSIS — M25662 Stiffness of left knee, not elsewhere classified: Secondary | ICD-10-CM | POA: Diagnosis not present

## 2024-09-15 DIAGNOSIS — M25562 Pain in left knee: Secondary | ICD-10-CM | POA: Diagnosis not present

## 2024-09-17 DIAGNOSIS — F4323 Adjustment disorder with mixed anxiety and depressed mood: Secondary | ICD-10-CM | POA: Diagnosis not present

## 2024-09-22 DIAGNOSIS — M25662 Stiffness of left knee, not elsewhere classified: Secondary | ICD-10-CM | POA: Diagnosis not present

## 2024-09-22 DIAGNOSIS — M25562 Pain in left knee: Secondary | ICD-10-CM | POA: Diagnosis not present

## 2024-09-23 DIAGNOSIS — Z96652 Presence of left artificial knee joint: Secondary | ICD-10-CM | POA: Diagnosis not present

## 2024-09-24 DIAGNOSIS — F4323 Adjustment disorder with mixed anxiety and depressed mood: Secondary | ICD-10-CM | POA: Diagnosis not present

## 2024-09-25 DIAGNOSIS — Z125 Encounter for screening for malignant neoplasm of prostate: Secondary | ICD-10-CM | POA: Diagnosis not present

## 2024-09-25 DIAGNOSIS — Z0189 Encounter for other specified special examinations: Secondary | ICD-10-CM | POA: Diagnosis not present

## 2024-09-25 DIAGNOSIS — R7989 Other specified abnormal findings of blood chemistry: Secondary | ICD-10-CM | POA: Diagnosis not present

## 2024-09-29 DIAGNOSIS — Z1339 Encounter for screening examination for other mental health and behavioral disorders: Secondary | ICD-10-CM | POA: Diagnosis not present

## 2024-09-29 DIAGNOSIS — Z1331 Encounter for screening for depression: Secondary | ICD-10-CM | POA: Diagnosis not present

## 2024-09-29 DIAGNOSIS — M25562 Pain in left knee: Secondary | ICD-10-CM | POA: Diagnosis not present

## 2024-09-29 DIAGNOSIS — Z0000 Encounter for general adult medical examination without abnormal findings: Secondary | ICD-10-CM | POA: Diagnosis not present

## 2024-09-29 DIAGNOSIS — K7689 Other specified diseases of liver: Secondary | ICD-10-CM | POA: Diagnosis not present

## 2024-09-29 DIAGNOSIS — M25662 Stiffness of left knee, not elsewhere classified: Secondary | ICD-10-CM | POA: Diagnosis not present

## 2024-10-01 DIAGNOSIS — M25662 Stiffness of left knee, not elsewhere classified: Secondary | ICD-10-CM | POA: Diagnosis not present

## 2024-10-01 DIAGNOSIS — M25562 Pain in left knee: Secondary | ICD-10-CM | POA: Diagnosis not present

## 2024-10-06 DIAGNOSIS — M25662 Stiffness of left knee, not elsewhere classified: Secondary | ICD-10-CM | POA: Diagnosis not present

## 2024-10-06 DIAGNOSIS — M25562 Pain in left knee: Secondary | ICD-10-CM | POA: Diagnosis not present

## 2024-10-13 DIAGNOSIS — M25562 Pain in left knee: Secondary | ICD-10-CM | POA: Diagnosis not present

## 2024-10-13 DIAGNOSIS — M25662 Stiffness of left knee, not elsewhere classified: Secondary | ICD-10-CM | POA: Diagnosis not present

## 2024-10-14 NOTE — Progress Notes (Signed)
 Anesthesia Review:  PCP: Cardiologist :  PPM/ ICD: Device Orders: Rep Notified:  Chest x-ray : EKG : Echo : Stress test: Cardiac Cath :   Activity level:  Sleep Study/ CPAP : Fasting Blood Sugar :      / Checks Blood Sugar -- times a day:    Blood Thinner/ Instructions /Last Dose: ASA / Instructions/ Last Dose :    81 mg aspirin

## 2024-10-14 NOTE — Progress Notes (Signed)
 Sent message, via epic in basket, requesting orders in epic from Care[facility] adviser.

## 2024-10-14 NOTE — Patient Instructions (Signed)
 SURGICAL WAITING ROOM VISITATION  Patients having surgery or a procedure may have no more than 2 support people in the waiting area - these visitors may rotate.    Children under the age of 14 must have an adult with them who is not the patient.  Visitors with respiratory illnesses are discouraged from visiting and should remain at home.  If the patient needs to stay at the hospital during part of their recovery, the visitor guidelines for inpatient rooms apply. Pre-op nurse will coordinate an appropriate time for 1 support person to accompany patient in pre-op.  This support person may not rotate.    Please refer to the [facility] website for the visitor guidelines for Inpatients (after your surgery is over and you are in a regular room).       Your procedure is scheduled on: 10/20/2024     Report to [facility] Main Entrance    Report to admitting at  215 pm      Call this number if you have problems the morning of surgery [phone]   Do not eat food :After Midnight.   After Midnight you may have the following liquids until _ 130 pm _____ AM DAY OF SURGERY  Water Non-Citrus Juices (without pulp, NO RED-Apple, White grape, White cranberry) Black Coffee (NO MILK/CREAM OR CREAMERS, sugar ok)  Clear Tea (NO MILK/CREAM OR CREAMERS, sugar ok) regular and decaf                             Plain Jell-O (NO RED)                                           Fruit ices (not with fruit pulp, NO RED)                                     Popsicles (NO RED)                                                               Sports drinks like Gatorade (NO RED)             .                If you have questions, please contact your surgeon's office.      Oral Hygiene is also important to reduce your risk of infection.                                    Remember - BRUSH YOUR TEETH THE MORNING OF SURGERY WITH YOUR REGULAR TOOTHPASTE  DENTURES WILL BE REMOVED PRIOR TO SURGERY PLEASE  DO NOT APPLY Poly grip OR ADHESIVES!!!   Do NOT smoke after Midnight   Stop all vitamins and herbal supplements 7 days before surgery.   Take these medicines the morning of surgery with A SIP OF WATER:  none   DO NOT TAKE ANY ORAL DIABETIC MEDICATIONS DAY OF YOUR SURGERY  Bring  CPAP mask and tubing day of surgery.                              You may not have any metal on your body including hair pins, jewelry, and body piercing             Do not wear make-up, lotions, powders, perfumes/cologne, or deodorant  Do not wear nail polish including gel and S&S, artificial/acrylic nails, or any other type of covering on natural nails including finger and toenails. If you have artificial nails, gel coating, etc. that needs to be removed by a nail salon please have this removed prior to surgery or surgery may need to be canceled/ delayed if the surgeon/ anesthesia feels like they are unable to be safely monitored.   Do not shave  48 hours prior to surgery.               Men may shave face and neck.   Do not bring valuables to the hospital. [facility] IS NOT             RESPONSIBLE   FOR VALUABLES.   Contacts, glasses, dentures or bridgework may not be worn into surgery.   Bring small overnight bag day of surgery.   DO NOT BRING YOUR HOME MEDICATIONS TO THE HOSPITAL. PHARMACY WILL DISPENSE MEDICATIONS LISTED ON YOUR MEDICATION LIST TO YOU DURING YOUR ADMISSION IN THE HOSPITAL!    Patients discharged on the day of surgery will not be allowed to drive home.  Someone NEEDS to stay with you for the first 24 hours after anesthesia.   Special Instructions: Bring a copy of your healthcare power of attorney and living will documents the day of surgery if you haven't scanned them before.              Please read over the following fact sheets you were given: IF YOU HAVE QUESTIONS ABOUT YOUR PRE-OP INSTRUCTIONS PLEASE CALL [phone].   If you received a COVID test during your pre-op visit  it  is requested that you wear a mask when out in public, stay away from anyone that may not be feeling well and notify your surgeon if you develop symptoms. If you test positive for Covid or have been in contact with anyone that has tested positive in the last 10 days please notify you surgeon.    [facility] [facility] - Preparing for Surgery Before surgery, you can play an important role.  Because skin is not sterile, your skin needs to be as free of germs as possible.  You can reduce the number of germs on your skin by washing with CHG (chlorahexidine gluconate) soap before surgery.  CHG is an antiseptic cleaner which kills germs and bonds with the skin to continue killing germs even after washing. Please DO NOT use if you have an allergy to CHG or antibacterial soaps.  If your skin becomes reddened/irritated stop using the CHG and inform your nurse when you arrive at Short Stay. Do not shave (including legs and underarms) for at least 48 hours prior to the first CHG shower.  You may shave your face/neck. Please follow these instructions carefully:  1.  Shower with CHG Soap the night before surgery and the  morning of Surgery.  2.  If you choose to wash your hair, wash your hair first as usual with your  normal  shampoo.  3.  After you shampoo, rinse  your hair and body thoroughly to remove the  shampoo.                           4.  Use CHG as you would any other liquid soap.  You can apply chg directly  to the skin and wash                       Gently with a scrungie or clean washcloth.  5.  Apply the CHG Soap to your body ONLY FROM THE NECK DOWN.   Do not use on face/ open                           Wound or open sores. Avoid contact with eyes, ears mouth and genitals (private parts).                       Wash face,  Genitals (private parts) with your normal soap.             6.  Wash thoroughly, paying special attention to the area where your surgery  will be performed.  7.  Thoroughly rinse your body with  warm water from the neck down.  8.  DO NOT shower/wash with your normal soap after using and rinsing off  the CHG Soap.                9.  Pat yourself dry with a clean towel.            10.  Wear clean pajamas.            11.  Place clean sheets on your bed the night of your first shower and do not  sleep with pets. Day of Surgery : Do not apply any lotions/deodorants the morning of surgery.  Please wear clean clothes to the hospital/surgery center.  FAILURE TO FOLLOW THESE INSTRUCTIONS MAY RESULT IN THE CANCELLATION OF YOUR SURGERY PATIENT SIGNATURE_________________________________  NURSE SIGNATURE__________________________________  ________________________________________________________________________

## 2024-10-16 ENCOUNTER — Encounter (HOSPITAL_COMMUNITY)
Admission: RE | Admit: 2024-10-16 | Discharge: 2024-10-16 | Disposition: A | Discharge status: Home / Self Care | Source: Ambulatory Visit

## 2024-10-16 ENCOUNTER — Encounter (HOSPITAL_COMMUNITY): Payer: Self-pay

## 2024-10-20 ENCOUNTER — Encounter (HOSPITAL_COMMUNITY): Admission: RE | Payer: Self-pay | Source: Home / Self Care

## 2024-10-20 ENCOUNTER — Ambulatory Visit (HOSPITAL_COMMUNITY): Admission: RE | Admit: 2024-10-20 | Admitting: Orthopedic Surgery

## 2024-10-20 DIAGNOSIS — M25662 Stiffness of left knee, not elsewhere classified: Secondary | ICD-10-CM | POA: Diagnosis not present

## 2024-10-20 DIAGNOSIS — M25562 Pain in left knee: Secondary | ICD-10-CM | POA: Diagnosis not present

## 2024-10-20 SURGERY — MANIPULATION, KNEE, CLOSED
Anesthesia: Choice | Site: Knee | Laterality: Left

## 2024-10-23 DIAGNOSIS — H903 Sensorineural hearing loss, bilateral: Secondary | ICD-10-CM | POA: Diagnosis not present

## 2024-10-23 DIAGNOSIS — H6121 Impacted cerumen, right ear: Secondary | ICD-10-CM | POA: Diagnosis not present
